# Patient Record
Sex: Female | Born: 1950 | Race: White | Hispanic: No | Marital: Married | Smoking: Former smoker
Health system: Southern US, Community
[De-identification: ages and names within clinical notes are randomized; demographics above are authoritative.]

## PROBLEM LIST (undated history)

## (undated) DIAGNOSIS — D649 Anemia, unspecified: Secondary | ICD-10-CM

## (undated) DIAGNOSIS — I10 Essential (primary) hypertension: Secondary | ICD-10-CM

## (undated) HISTORY — DX: Essential (primary) hypertension: I10

## (undated) HISTORY — PX: EYE SURGERY: SHX253

## (undated) HISTORY — PX: ABDOMINAL HYSTERECTOMY: SHX81

## (undated) HISTORY — PX: PARTIAL HYSTERECTOMY: SHX80

## (undated) HISTORY — PX: CHOLECYSTECTOMY: SHX55

---

## 1997-04-10 ENCOUNTER — Ambulatory Visit (HOSPITAL_COMMUNITY): Admission: RE | Admit: 1997-04-10 | Discharge: 1997-04-10 | Payer: Self-pay | Admitting: Obstetrics and Gynecology

## 1998-03-21 ENCOUNTER — Other Ambulatory Visit: Admission: RE | Admit: 1998-03-21 | Discharge: 1998-03-21 | Payer: Self-pay | Admitting: Obstetrics and Gynecology

## 1998-04-06 ENCOUNTER — Ambulatory Visit (HOSPITAL_COMMUNITY): Admission: RE | Admit: 1998-04-06 | Discharge: 1998-04-06 | Payer: Self-pay | Admitting: Obstetrics and Gynecology

## 1999-04-23 ENCOUNTER — Other Ambulatory Visit: Admission: RE | Admit: 1999-04-23 | Discharge: 1999-04-23 | Payer: Self-pay | Admitting: Obstetrics and Gynecology

## 1999-05-30 ENCOUNTER — Encounter: Payer: Self-pay | Admitting: Obstetrics and Gynecology

## 1999-05-30 ENCOUNTER — Encounter: Admission: RE | Admit: 1999-05-30 | Discharge: 1999-05-30 | Payer: Self-pay | Admitting: Obstetrics and Gynecology

## 2000-09-09 ENCOUNTER — Encounter: Admission: RE | Admit: 2000-09-09 | Discharge: 2000-09-09 | Payer: Self-pay | Admitting: Obstetrics and Gynecology

## 2000-09-09 ENCOUNTER — Encounter: Payer: Self-pay | Admitting: Obstetrics and Gynecology

## 2000-10-22 ENCOUNTER — Other Ambulatory Visit: Admission: RE | Admit: 2000-10-22 | Discharge: 2000-10-22 | Payer: Self-pay | Admitting: Obstetrics and Gynecology

## 2002-07-05 ENCOUNTER — Encounter: Payer: Self-pay | Admitting: Obstetrics and Gynecology

## 2002-07-05 ENCOUNTER — Encounter: Admission: RE | Admit: 2002-07-05 | Discharge: 2002-07-05 | Payer: Self-pay | Admitting: Obstetrics and Gynecology

## 2002-07-22 ENCOUNTER — Encounter: Payer: Self-pay | Admitting: Gastroenterology

## 2002-07-22 ENCOUNTER — Encounter: Admission: RE | Admit: 2002-07-22 | Discharge: 2002-07-22 | Payer: Self-pay | Admitting: Gastroenterology

## 2003-11-07 ENCOUNTER — Ambulatory Visit (HOSPITAL_COMMUNITY): Admission: RE | Admit: 2003-11-07 | Discharge: 2003-11-07 | Payer: Self-pay | Admitting: Gastroenterology

## 2003-11-07 ENCOUNTER — Encounter (INDEPENDENT_AMBULATORY_CARE_PROVIDER_SITE_OTHER): Payer: Self-pay | Admitting: *Deleted

## 2005-04-02 ENCOUNTER — Encounter: Admission: RE | Admit: 2005-04-02 | Discharge: 2005-04-02 | Payer: Self-pay | Admitting: Obstetrics and Gynecology

## 2005-05-14 ENCOUNTER — Encounter: Payer: Self-pay | Admitting: Obstetrics and Gynecology

## 2006-08-04 ENCOUNTER — Encounter: Admission: RE | Admit: 2006-08-04 | Discharge: 2006-08-04 | Payer: Self-pay | Admitting: Family Medicine

## 2006-10-24 ENCOUNTER — Encounter: Admission: RE | Admit: 2006-10-24 | Discharge: 2006-10-24 | Payer: Self-pay | Admitting: Specialist

## 2006-11-30 ENCOUNTER — Encounter: Admission: RE | Admit: 2006-11-30 | Discharge: 2006-12-16 | Payer: Self-pay | Admitting: Neurosurgery

## 2008-05-30 ENCOUNTER — Emergency Department (HOSPITAL_BASED_OUTPATIENT_CLINIC_OR_DEPARTMENT_OTHER): Admission: EM | Admit: 2008-05-30 | Discharge: 2008-05-31 | Payer: Self-pay | Admitting: Emergency Medicine

## 2008-05-31 ENCOUNTER — Ambulatory Visit: Payer: Self-pay | Admitting: Interventional Radiology

## 2008-12-18 ENCOUNTER — Encounter: Admission: RE | Admit: 2008-12-18 | Discharge: 2008-12-18 | Payer: Self-pay | Admitting: Obstetrics and Gynecology

## 2010-02-03 ENCOUNTER — Encounter: Payer: Self-pay | Admitting: Obstetrics and Gynecology

## 2010-03-11 ENCOUNTER — Emergency Department (HOSPITAL_COMMUNITY): Payer: BC Managed Care – PPO

## 2010-03-11 ENCOUNTER — Emergency Department (HOSPITAL_COMMUNITY)
Admission: EM | Admit: 2010-03-11 | Discharge: 2010-03-11 | Disposition: A | Discharge status: Home / Self Care | Payer: BC Managed Care – PPO | Attending: Emergency Medicine | Admitting: Emergency Medicine

## 2010-03-11 DIAGNOSIS — I10 Essential (primary) hypertension: Secondary | ICD-10-CM | POA: Insufficient documentation

## 2010-03-11 DIAGNOSIS — R109 Unspecified abdominal pain: Secondary | ICD-10-CM | POA: Insufficient documentation

## 2010-03-11 DIAGNOSIS — N281 Cyst of kidney, acquired: Secondary | ICD-10-CM | POA: Insufficient documentation

## 2010-03-11 DIAGNOSIS — Z9089 Acquired absence of other organs: Secondary | ICD-10-CM | POA: Insufficient documentation

## 2010-03-11 LAB — CBC
Hemoglobin: 12.8 g/dL (ref 12.0–15.0)
Platelets: 218 10*3/uL (ref 150–400)
RBC: 4.32 MIL/uL (ref 3.87–5.11)
WBC: 8.2 10*3/uL (ref 4.0–10.5)

## 2010-03-11 LAB — DIFFERENTIAL
Basophils Relative: 0 % (ref 0–1)
Eosinophils Absolute: 0.1 10*3/uL (ref 0.0–0.7)
Lymphs Abs: 1.5 10*3/uL (ref 0.7–4.0)
Monocytes Relative: 3 % (ref 3–12)
Neutro Abs: 6.3 10*3/uL (ref 1.7–7.7)
Neutrophils Relative %: 77 % (ref 43–77)

## 2010-03-11 LAB — URINALYSIS, ROUTINE W REFLEX MICROSCOPIC
Hgb urine dipstick: NEGATIVE
Protein, ur: NEGATIVE mg/dL
Specific Gravity, Urine: 1.02 (ref 1.005–1.030)
Urine Glucose, Fasting: NEGATIVE mg/dL
Urobilinogen, UA: 0.2 mg/dL (ref 0.0–1.0)

## 2010-03-11 LAB — COMPREHENSIVE METABOLIC PANEL
ALT: 17 U/L (ref 0–35)
AST: 23 U/L (ref 0–37)
Alkaline Phosphatase: 55 U/L (ref 39–117)
CO2: 27 mEq/L (ref 19–32)
Chloride: 104 mEq/L (ref 96–112)
GFR calc non Af Amer: >60 mL/min (ref >60)
Glucose, Bld: 144 mg/dL — ABNORMAL HIGH (ref 70–99)
Sodium: 139 mEq/L (ref 135–145)
Total Bilirubin: 0.5 mg/dL (ref 0.3–1.2)

## 2010-03-11 MED ORDER — IOHEXOL 300 MG/ML  SOLN
100.0000 mL | Freq: Once | INTRAMUSCULAR | Status: AC | PRN
  Administered 2010-03-11: 100 mL via INTRAVENOUS

## 2010-04-15 ENCOUNTER — Ambulatory Visit
Admission: RE | Admit: 2010-04-15 | Discharge: 2010-04-15 | Disposition: A | Discharge status: Home / Self Care | Payer: BC Managed Care – PPO | Source: Ambulatory Visit | Attending: Family Medicine | Admitting: Family Medicine

## 2010-04-15 ENCOUNTER — Emergency Department (HOSPITAL_COMMUNITY)
Admission: EM | Admit: 2010-04-15 | Discharge: 2010-04-16 | Disposition: A | Discharge status: Home / Self Care | Payer: BC Managed Care – PPO | Attending: Emergency Medicine | Admitting: Emergency Medicine

## 2010-04-15 ENCOUNTER — Other Ambulatory Visit: Payer: Self-pay | Admitting: Family Medicine

## 2010-04-15 DIAGNOSIS — R109 Unspecified abdominal pain: Secondary | ICD-10-CM | POA: Insufficient documentation

## 2010-04-15 DIAGNOSIS — R112 Nausea with vomiting, unspecified: Secondary | ICD-10-CM | POA: Insufficient documentation

## 2010-04-15 DIAGNOSIS — R1031 Right lower quadrant pain: Secondary | ICD-10-CM

## 2010-04-15 DIAGNOSIS — M545 Low back pain: Secondary | ICD-10-CM

## 2010-04-15 DIAGNOSIS — Z90710 Acquired absence of both cervix and uterus: Secondary | ICD-10-CM | POA: Insufficient documentation

## 2010-04-15 DIAGNOSIS — Z9089 Acquired absence of other organs: Secondary | ICD-10-CM | POA: Insufficient documentation

## 2010-04-16 LAB — URINALYSIS, ROUTINE W REFLEX MICROSCOPIC
Bilirubin Urine: NEGATIVE
Glucose, UA: NEGATIVE mg/dL
Ketones, ur: 40 mg/dL — AB
Protein, ur: NEGATIVE mg/dL

## 2010-04-16 LAB — URINE MICROSCOPIC-ADD ON

## 2010-04-17 ENCOUNTER — Other Ambulatory Visit: Payer: Self-pay | Admitting: Surgery

## 2010-04-17 DIAGNOSIS — R1031 Right lower quadrant pain: Secondary | ICD-10-CM

## 2010-04-17 LAB — URINE CULTURE

## 2010-04-19 ENCOUNTER — Ambulatory Visit
Admission: RE | Admit: 2010-04-19 | Discharge: 2010-04-19 | Disposition: A | Discharge status: Home / Self Care | Payer: BC Managed Care – PPO | Source: Ambulatory Visit | Attending: Surgery | Admitting: Surgery

## 2010-04-19 DIAGNOSIS — R1031 Right lower quadrant pain: Secondary | ICD-10-CM

## 2010-04-23 LAB — COMPREHENSIVE METABOLIC PANEL
ALT: 12 U/L (ref 0–35)
BUN: 18 mg/dL (ref 6–23)
Calcium: 9.4 mg/dL (ref 8.4–10.5)
Glucose, Bld: 134 mg/dL — ABNORMAL HIGH (ref 70–99)
Sodium: 144 mEq/L (ref 135–145)
Total Protein: 7.9 g/dL (ref 6.0–8.3)

## 2010-04-23 LAB — DIFFERENTIAL
Lymphocytes Relative: 17 % (ref 12–46)
Lymphs Abs: 2.3 10*3/uL (ref 0.7–4.0)
Neutrophils Relative %: 79 % — ABNORMAL HIGH (ref 43–77)

## 2010-04-23 LAB — CBC
MCHC: 34.1 g/dL (ref 30.0–36.0)
MCV: 89.1 fL (ref 78.0–100.0)
RBC: 4.25 MIL/uL (ref 3.87–5.11)
RDW: 12.8 % (ref 11.5–15.5)

## 2010-04-23 LAB — URINALYSIS, ROUTINE W REFLEX MICROSCOPIC
Nitrite: NEGATIVE
Specific Gravity, Urine: 1.02 (ref 1.005–1.030)
pH: 6.5 (ref 5.0–8.0)

## 2010-04-23 LAB — URINE CULTURE: Colony Count: NO GROWTH

## 2010-04-23 LAB — LIPASE, BLOOD: Lipase: 78 U/L (ref 23–300)

## 2010-05-31 NOTE — Op Note (Signed)
NAMEDONETA, BAYMAN              ACCOUNT NO.:  [id number]   MEDICAL RECORD NO.:  [id number]          PATIENT TYPE:  AMB   LOCATION:  ENDO                         FACILITY:  [facility]   PHYSICIAN:  John C. Madilyn Fireman, M.D.    DATE OF BIRTH:  Jun 08, 1950   DATE OF PROCEDURE:  11/07/2003  DATE OF DISCHARGE:                                 OPERATIVE REPORT   PROCEDURE:  Colonoscopy.   INDICATION FOR PROCEDURE:  Rectal bleeding and family history of colon  cancer in a second degree relative.   PROCEDURE:  The patient was placed in the left lateral decubitus position  and placed on the pulse monitor with continuous low-flow oxygen delivered by  nasal cannula.  She was sedated with 100 mcg IV fentanyl and 10 mg IV  Versed.  The Olympus video colonoscope was inserted into the rectum and  advanced to the cecum, confirmed by transillumination at McBurney's point  and visualization of the ileocecal valve and appendiceal orifice.  The prep  was good.  The cecum and ascending colon appeared normal with no masses,  polyps, diverticula, or other mucosal abnormalities.  Within the transverse  colon there was an 8 x 4 mm sessile polyp that was removed by snare.  The  remainder of the transverse, descending, and sigmoid colon appeared normal.  Within the rectum there was seen an 8 mm polyp at 12 cm that was removed by  snare.  The scope was then withdrawn and the patient returned to the  recovery room in stable condition.  She tolerated the procedure well, and  there were no immediate complications.   IMPRESSION:  Transverse and rectal polyps.   PLAN:  Await histology to determine method and interval for future colon  screening.      JCH/MEDQ  D:  11/07/2003  T:  11/07/2003  Job:  [id number]   cc:   Chales Salmon. Abigail Miyamoto, M.D.  [address]  [place]  [place] [address]  Fax: [phone]

## 2010-07-26 ENCOUNTER — Other Ambulatory Visit: Payer: Self-pay | Admitting: Obstetrics and Gynecology

## 2010-07-26 DIAGNOSIS — Z1231 Encounter for screening mammogram for malignant neoplasm of breast: Secondary | ICD-10-CM

## 2010-08-12 ENCOUNTER — Ambulatory Visit
Admission: RE | Admit: 2010-08-12 | Discharge: 2010-08-12 | Disposition: A | Discharge status: Home / Self Care | Payer: BC Managed Care – PPO | Source: Ambulatory Visit | Attending: Obstetrics and Gynecology | Admitting: Obstetrics and Gynecology

## 2010-08-12 DIAGNOSIS — Z1231 Encounter for screening mammogram for malignant neoplasm of breast: Secondary | ICD-10-CM

## 2011-07-03 IMAGING — CT CT ABD-PELV W/O CM
2 of 4 series · 17 of 46 positions shown, 19 images · non-contrast
Comparison: 03/11/2010

CLINICAL DATA: Low back pain and right lower quadrant pain.

CT ABDOMEN AND PELVIS WITHOUT CONTRAST
TECHNIQUE: Multidetector CT imaging of the abdomen and pelvis was
performed following the standard protocol without intravenous
contrast.

[Series 2: renal stone w/o · axial · non-contrast · 0.80mm/px · z∈[-458,-42]mm · 14 of 91 slices shown, 16 images]
[im 4/91  soft-tissue]
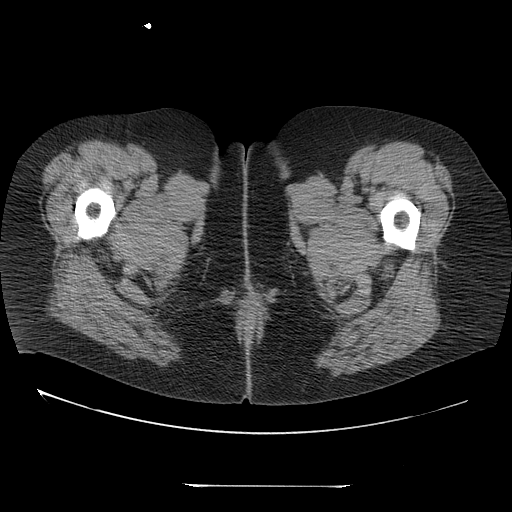
[im 4/91  bone]
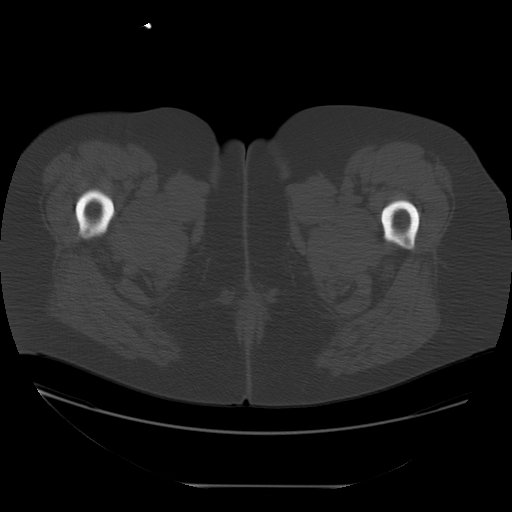
[im 12/91  soft-tissue]
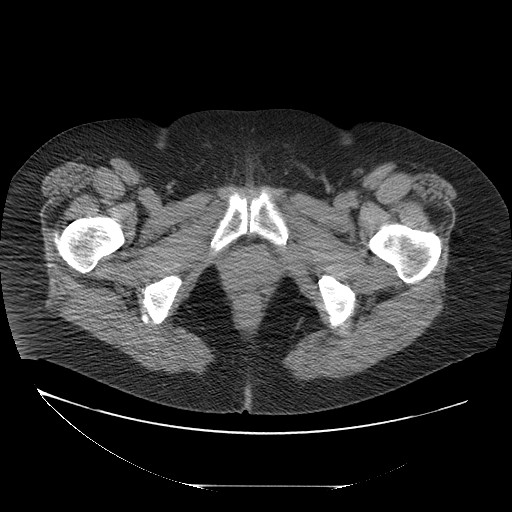
[im 19/91  soft-tissue]
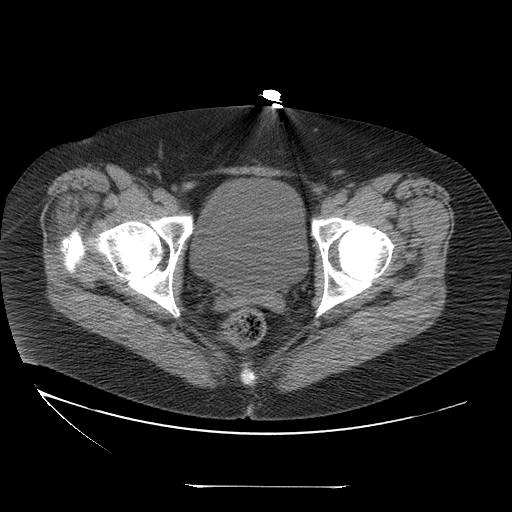
[im 23/91  soft-tissue]
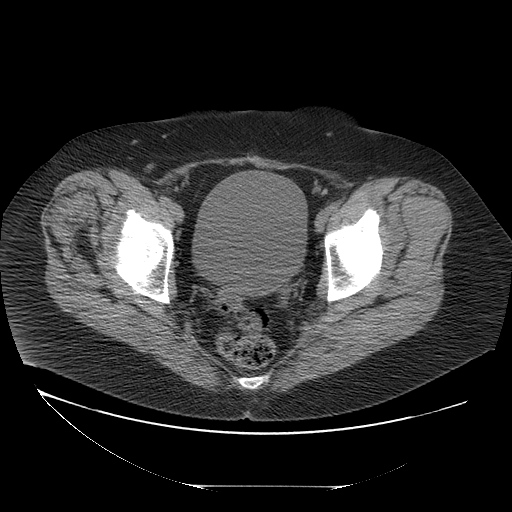
[im 31/91  soft-tissue]
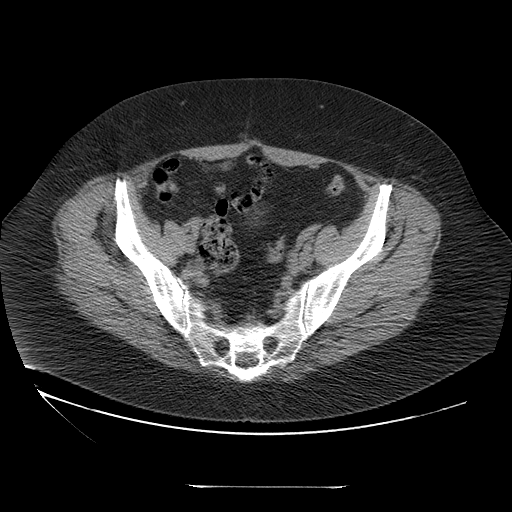
[im 38/91  soft-tissue]
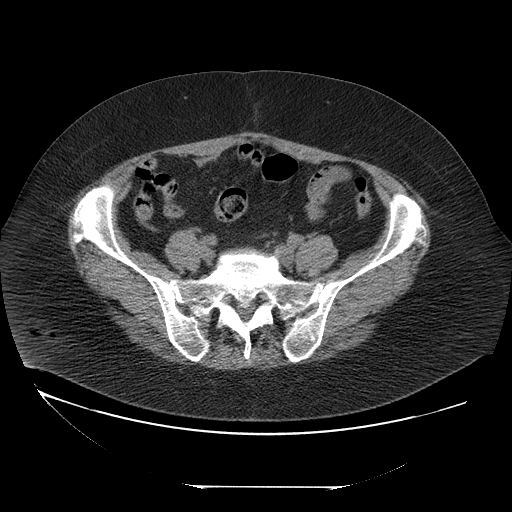
[im 42/91  soft-tissue]
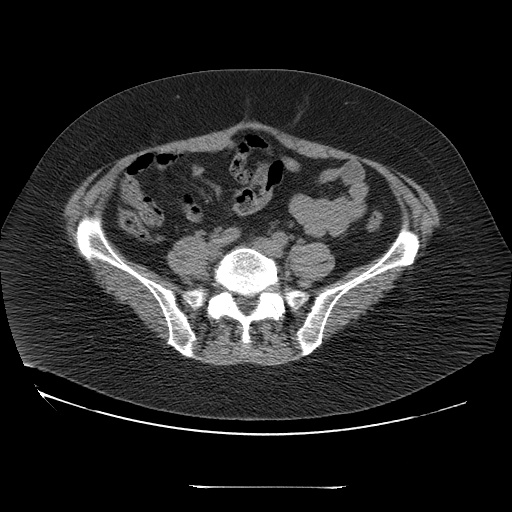
[im 49/91  soft-tissue]
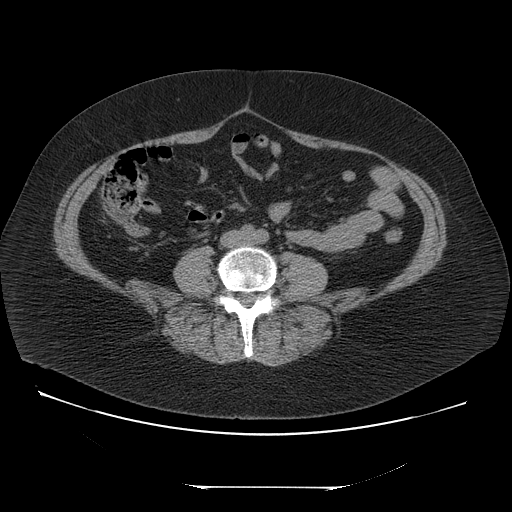
[im 53/91  soft-tissue]
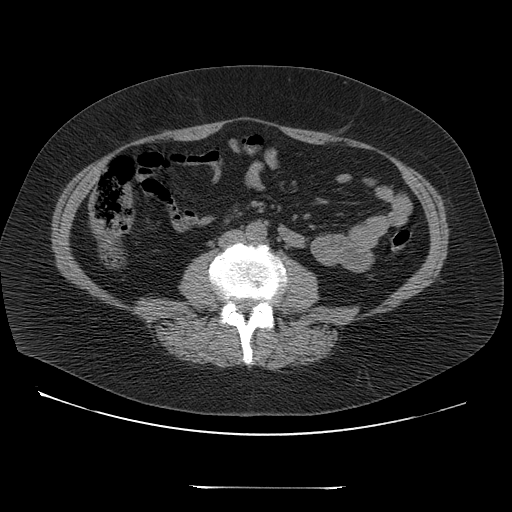
[im 53/91  bone]
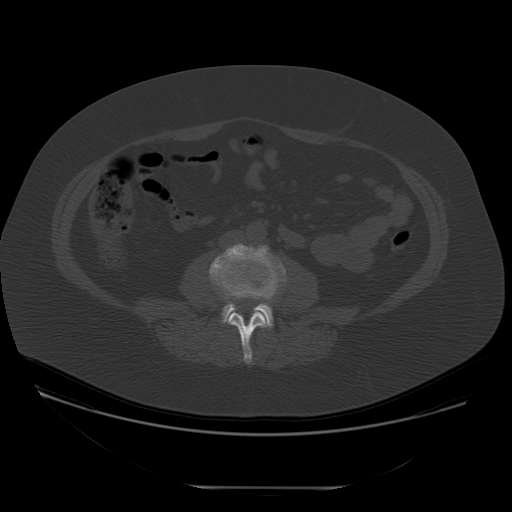
[im 61/91  soft-tissue]
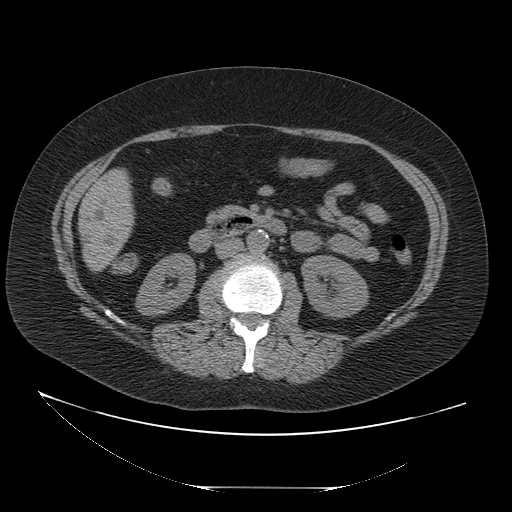
[im 68/91  soft-tissue]
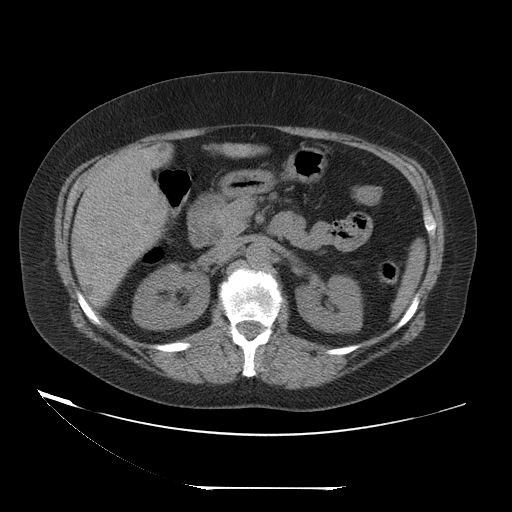
[im 72/91  soft-tissue]
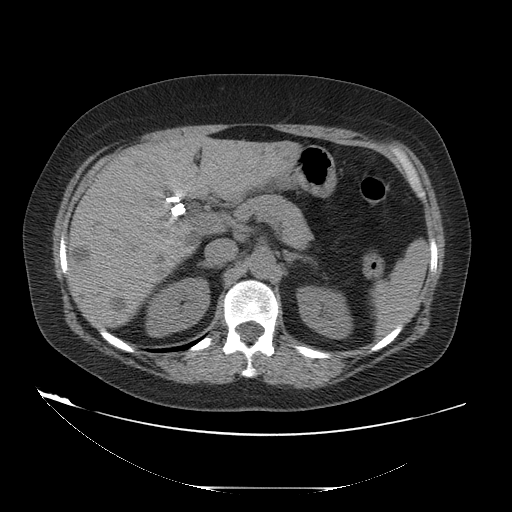
[im 79/91  soft-tissue]
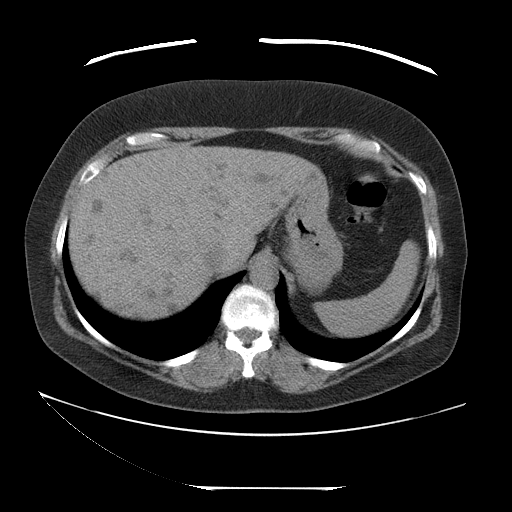
[im 87/91  soft-tissue]
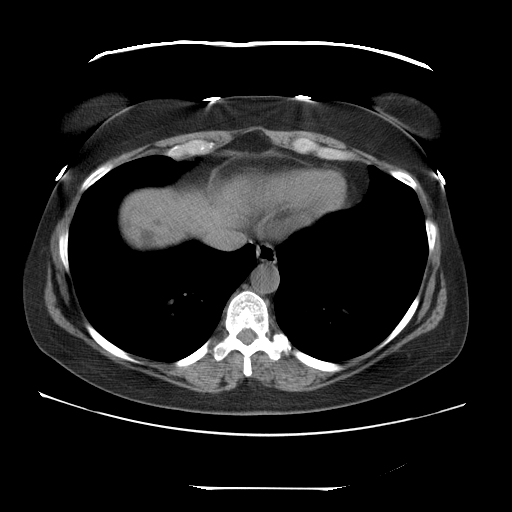

[Series 400: cor · coronal · 0.90mm/px · 3 of 118 slices shown]
[im 40/118  soft-tissue]
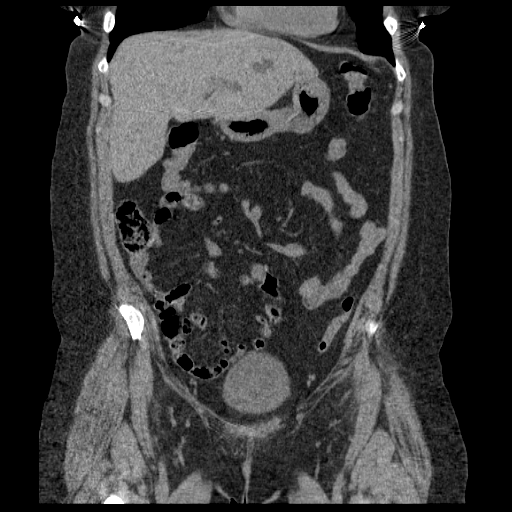
[im 53/118  soft-tissue]
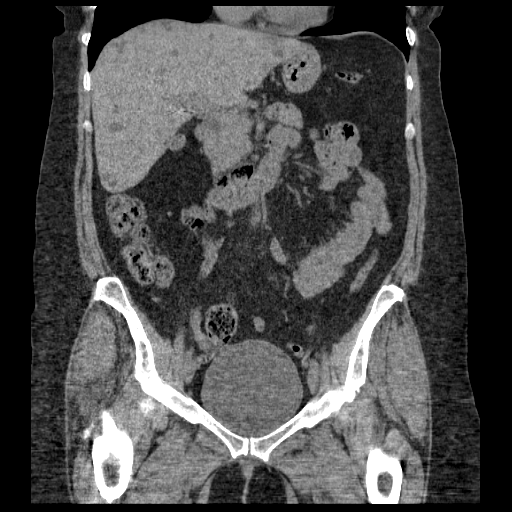
[im 66/118  soft-tissue]
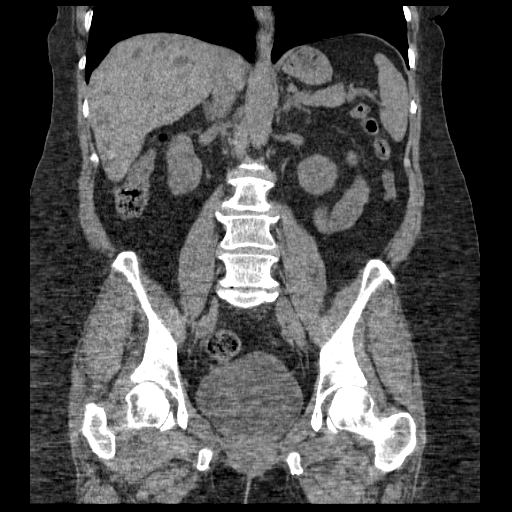

[17 of 46 positions shown; findings below may reference images not displayed]

FINDINGS: There are numerous cystic lesions in the liver, stable.
The pancreas, spleen, adrenal glands, and kidneys are normal. There
are no ureteral or renal calculi.

The appendix and terminal ileum are normal.  The remainder of the
bowel is also normal.  Ovaries are normal.  Uterus and gallbladder
have been removed.  There is no free air or free fluid in the
abdomen.  No significant osseous abnormality.
IMPRESSION: 1.  No acute abnormalities.
2.  Numerous stable cystic lesions in the liver.
3.  Normal.  Kidneys and appendix.

## 2011-07-07 IMAGING — US US TRANSVAGINAL NON-OB
1 series · 14 of 25 positions shown · non-contrast
Comparison: CT scan of the abdomen and pelvis dated 04/15/2010

CLINICAL DATA: Right lower quadrant pain.

TRANSABDOMINAL AND TRANSVAGINAL ULTRASOUND OF PELVIS
TECHNIQUE: Both transabdominal and transvaginal ultrasound
examinations of the pelvis were performed. Transabdominal technique
was performed for global imaging of the pelvis including ovaries,
adnexal regions, and pelvic cul-de-sac.
It was necessary to proceed with endovaginal exam following the
transabdominal exam to visualize the ovaries and adnexa.

[Series 1: us transvaginal non-ob · 0.30mm/px · 14 of 43 slices shown]
[im 1/43]
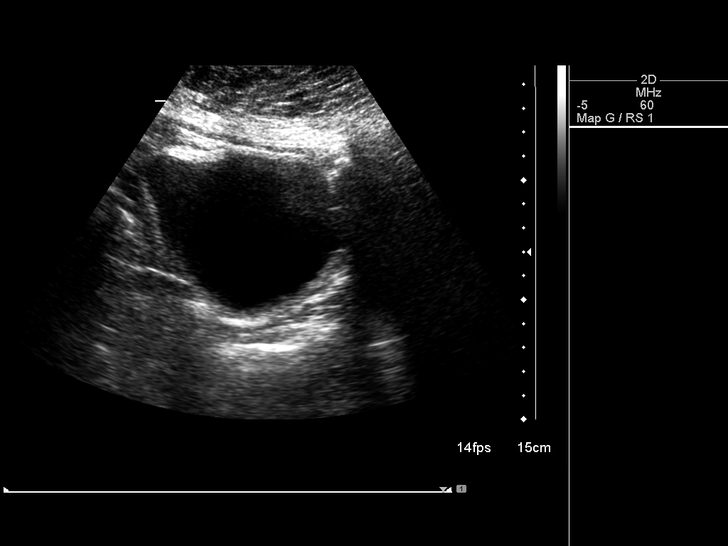
[im 4/43]
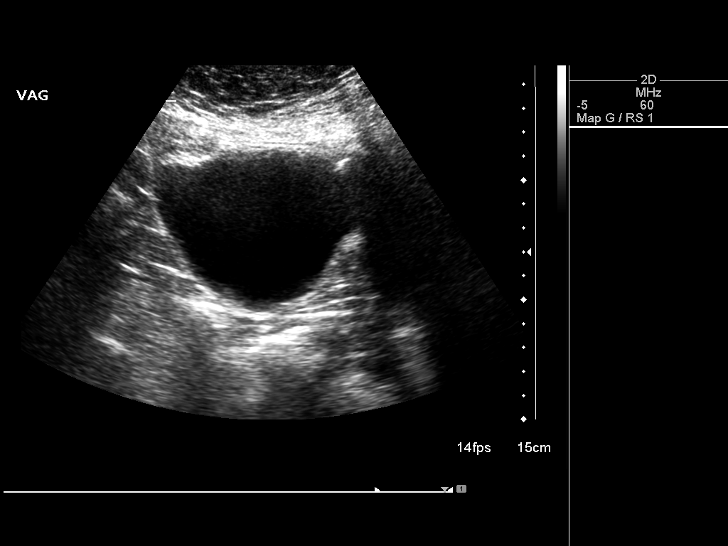
[im 8/43]
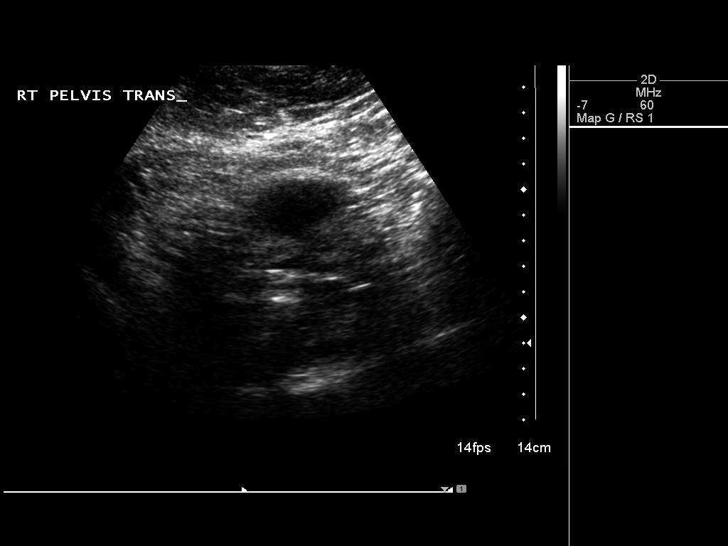
[im 11/43]
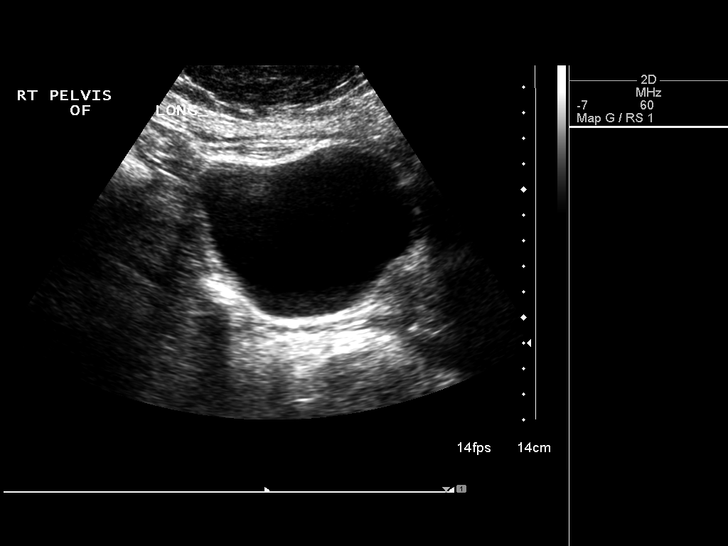
[im 15/43]
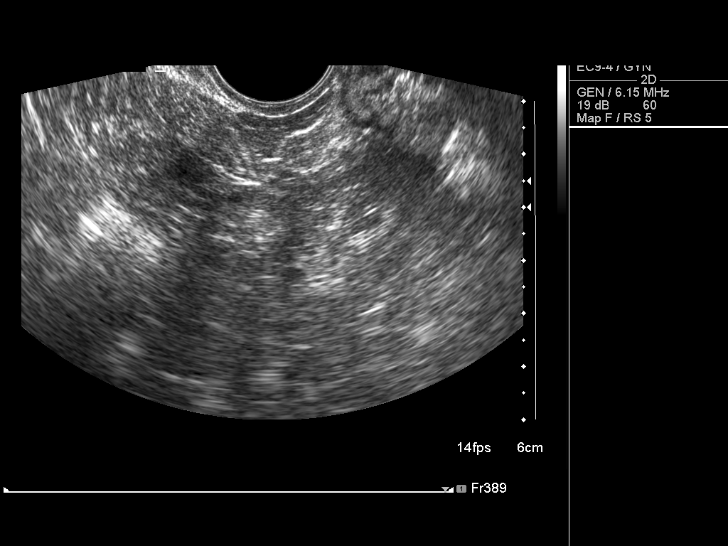
[im 16/43]
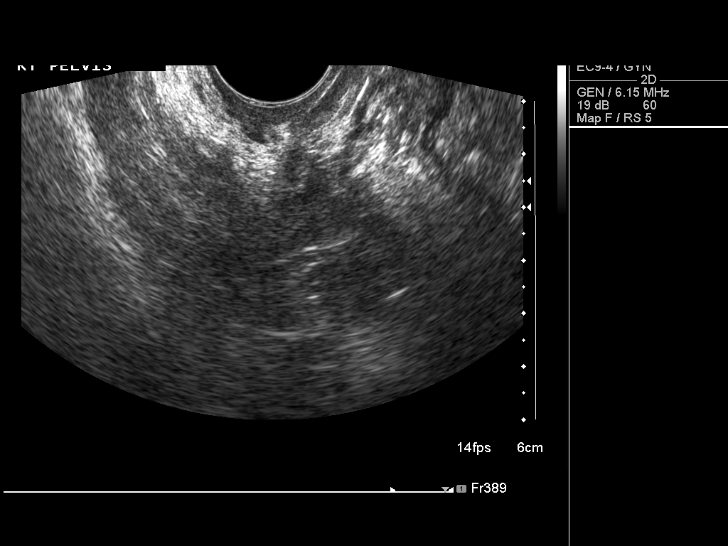
[im 20/43]
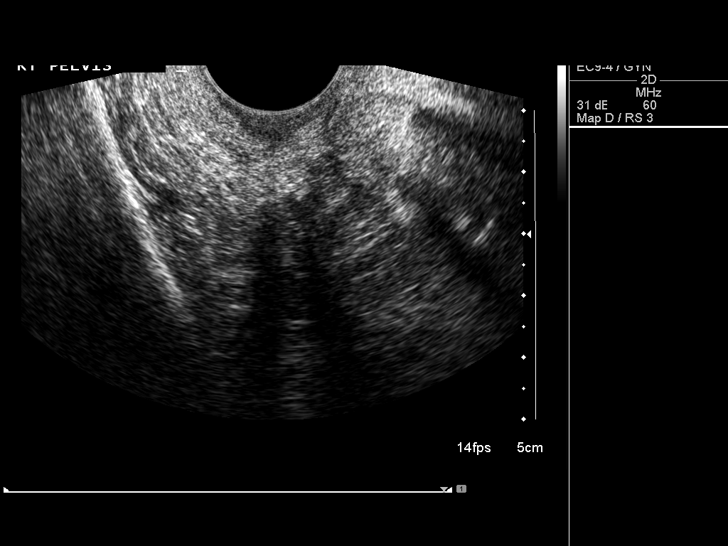
[im 23/43]
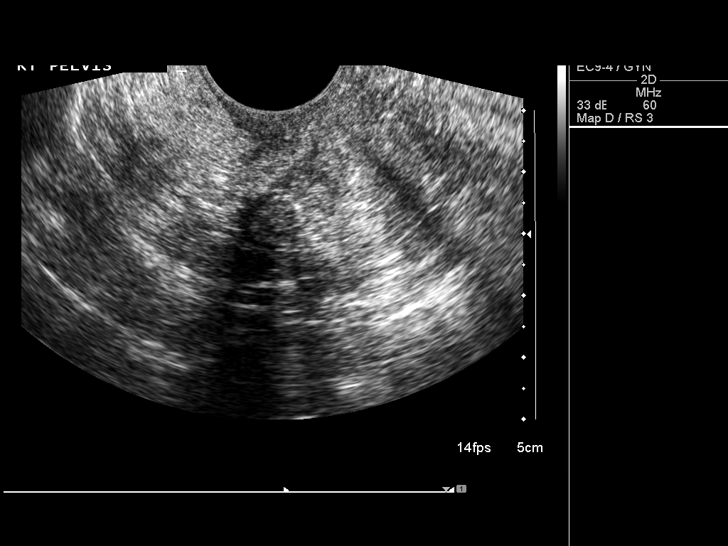
[im 27/43]
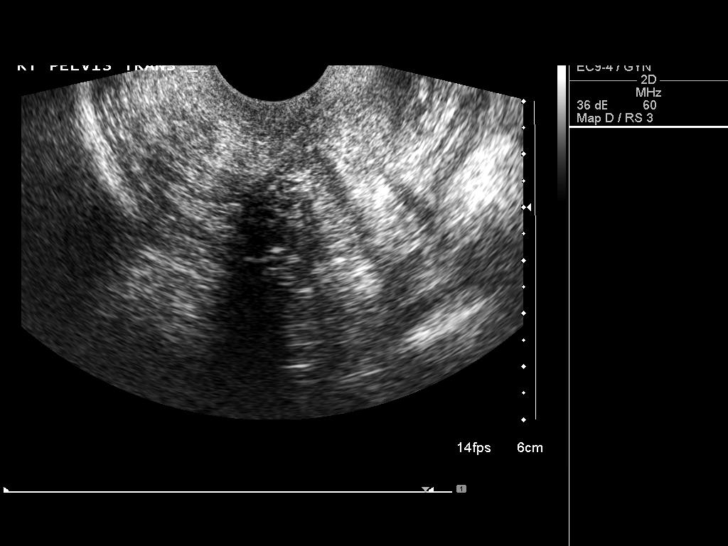
[im 29/43]
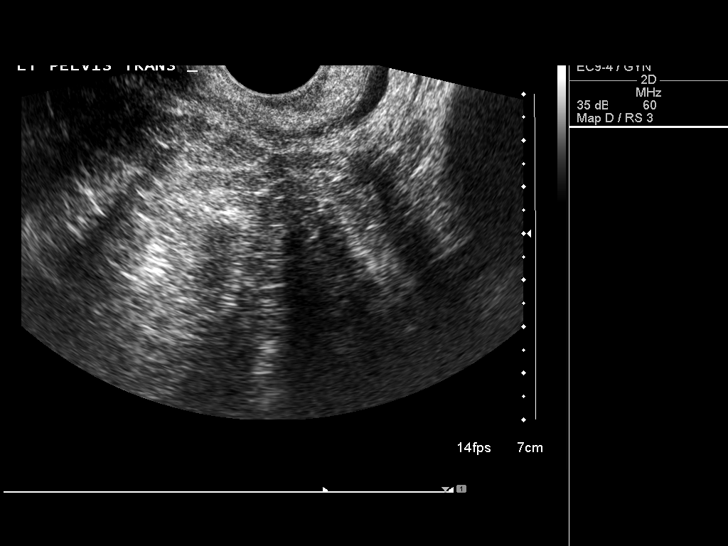
[im 32/43]
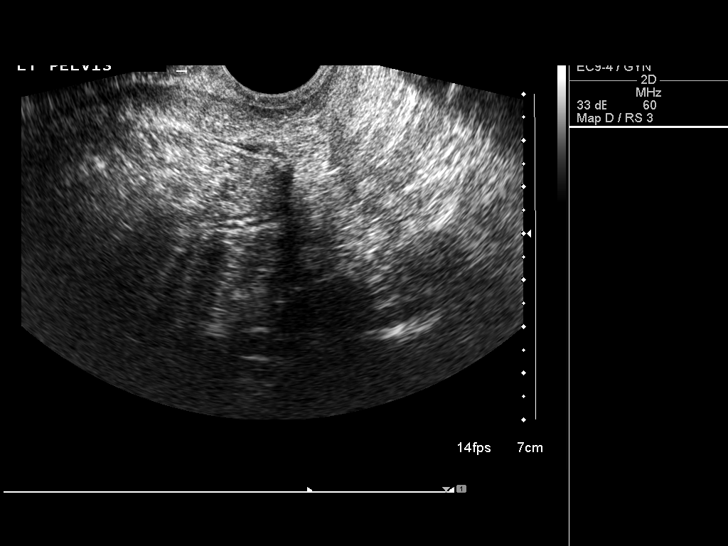
[im 36/43]
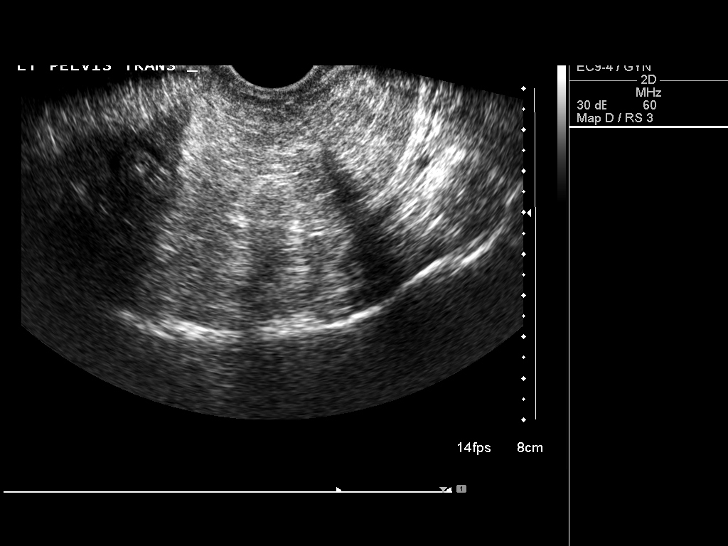
[im 39/43]
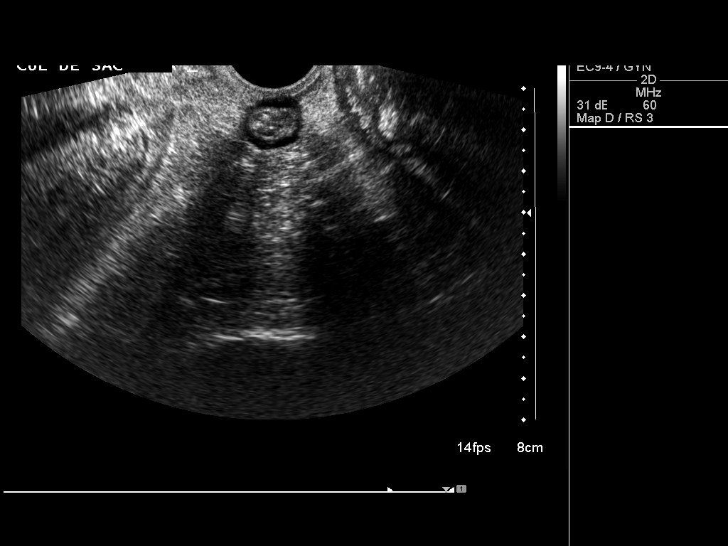
[im 43/43]
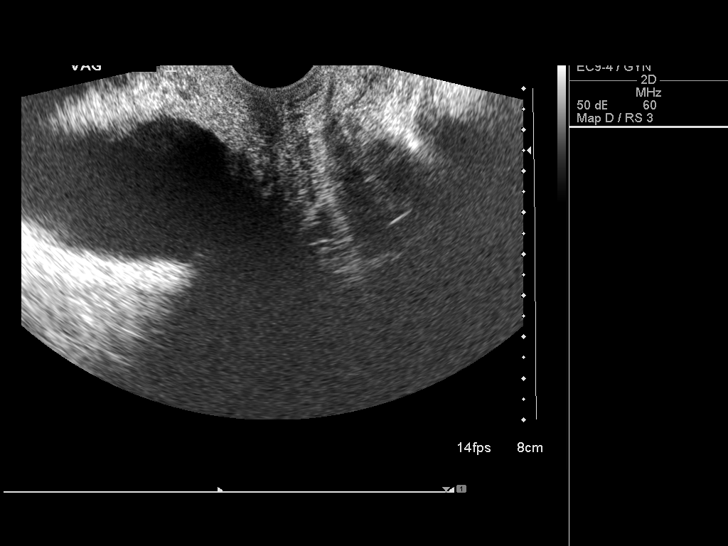

[14 of 25 positions shown; findings below may reference images not displayed]

FINDINGS: Uterus:  Removed.

Right ovary:  Not visualized.

Left ovary:  Not visualized.

Other findings:  No free fluid.
IMPRESSION: The uterus has been removed.  The ovaries are not identified on
ultrasound.  Review of the prior CT scan dated 04/15/2010
demonstrates normal appearing ovaries bilaterally lying anterior to
the distal psoas muscles.

## 2013-08-15 ENCOUNTER — Other Ambulatory Visit: Payer: Self-pay

## 2013-08-15 DIAGNOSIS — Z1231 Encounter for screening mammogram for malignant neoplasm of breast: Secondary | ICD-10-CM

## 2013-08-19 ENCOUNTER — Ambulatory Visit: Payer: BC Managed Care – PPO

## 2013-08-29 ENCOUNTER — Ambulatory Visit
Admission: RE | Admit: 2013-08-29 | Discharge: 2013-08-29 | Disposition: A | Discharge status: Home / Self Care | Payer: BC Managed Care – PPO | Source: Ambulatory Visit

## 2013-08-29 DIAGNOSIS — Z1231 Encounter for screening mammogram for malignant neoplasm of breast: Secondary | ICD-10-CM

## 2013-09-02 ENCOUNTER — Other Ambulatory Visit: Payer: Self-pay | Admitting: Family Medicine

## 2013-09-02 DIAGNOSIS — N63 Unspecified lump in unspecified breast: Secondary | ICD-10-CM

## 2013-09-06 ENCOUNTER — Other Ambulatory Visit: Payer: Self-pay | Admitting: Family Medicine

## 2013-09-06 DIAGNOSIS — N644 Mastodynia: Secondary | ICD-10-CM

## 2013-09-06 DIAGNOSIS — N6489 Other specified disorders of breast: Secondary | ICD-10-CM

## 2013-09-06 DIAGNOSIS — R2231 Localized swelling, mass and lump, right upper limb: Secondary | ICD-10-CM

## 2013-09-09 ENCOUNTER — Ambulatory Visit
Admission: RE | Admit: 2013-09-09 | Discharge: 2013-09-09 | Disposition: A | Discharge status: Home / Self Care | Payer: BC Managed Care – PPO | Source: Ambulatory Visit | Attending: Family Medicine | Admitting: Family Medicine

## 2013-09-09 DIAGNOSIS — N6489 Other specified disorders of breast: Secondary | ICD-10-CM

## 2013-09-09 DIAGNOSIS — N644 Mastodynia: Secondary | ICD-10-CM

## 2013-09-09 DIAGNOSIS — R2231 Localized swelling, mass and lump, right upper limb: Secondary | ICD-10-CM

## 2014-12-11 ENCOUNTER — Other Ambulatory Visit: Payer: Self-pay

## 2014-12-11 DIAGNOSIS — Z1231 Encounter for screening mammogram for malignant neoplasm of breast: Secondary | ICD-10-CM

## 2015-01-22 ENCOUNTER — Ambulatory Visit: Payer: BC Managed Care – PPO

## 2015-02-19 ENCOUNTER — Ambulatory Visit
Admission: RE | Admit: 2015-02-19 | Discharge: 2015-02-19 | Disposition: A | Discharge status: Home / Self Care | Payer: BC Managed Care – PPO | Source: Ambulatory Visit

## 2015-02-19 DIAGNOSIS — Z1231 Encounter for screening mammogram for malignant neoplasm of breast: Secondary | ICD-10-CM

## 2016-02-13 ENCOUNTER — Other Ambulatory Visit: Payer: Self-pay | Admitting: Family Medicine

## 2016-02-13 DIAGNOSIS — Z1231 Encounter for screening mammogram for malignant neoplasm of breast: Secondary | ICD-10-CM

## 2016-04-01 ENCOUNTER — Ambulatory Visit
Admission: RE | Admit: 2016-04-01 | Discharge: 2016-04-01 | Disposition: A | Discharge status: Home / Self Care | Payer: BC Managed Care – PPO | Source: Ambulatory Visit | Attending: Family Medicine | Admitting: Family Medicine

## 2016-04-01 DIAGNOSIS — Z1231 Encounter for screening mammogram for malignant neoplasm of breast: Secondary | ICD-10-CM

## 2016-04-17 DIAGNOSIS — H40011 Open angle with borderline findings, low risk, right eye: Secondary | ICD-10-CM | POA: Diagnosis not present

## 2016-04-17 DIAGNOSIS — H268 Other specified cataract: Secondary | ICD-10-CM | POA: Diagnosis not present

## 2016-04-17 DIAGNOSIS — H40013 Open angle with borderline findings, low risk, bilateral: Secondary | ICD-10-CM | POA: Diagnosis not present

## 2016-04-17 DIAGNOSIS — H04123 Dry eye syndrome of bilateral lacrimal glands: Secondary | ICD-10-CM | POA: Diagnosis not present

## 2016-04-17 DIAGNOSIS — H40053 Ocular hypertension, bilateral: Secondary | ICD-10-CM | POA: Diagnosis not present

## 2016-05-12 DIAGNOSIS — H40012 Open angle with borderline findings, low risk, left eye: Secondary | ICD-10-CM | POA: Diagnosis not present

## 2016-06-30 DIAGNOSIS — H25041 Posterior subcapsular polar age-related cataract, right eye: Secondary | ICD-10-CM | POA: Diagnosis not present

## 2016-06-30 DIAGNOSIS — H25013 Cortical age-related cataract, bilateral: Secondary | ICD-10-CM | POA: Diagnosis not present

## 2016-06-30 DIAGNOSIS — H40013 Open angle with borderline findings, low risk, bilateral: Secondary | ICD-10-CM | POA: Diagnosis not present

## 2016-06-30 DIAGNOSIS — H25011 Cortical age-related cataract, right eye: Secondary | ICD-10-CM | POA: Diagnosis not present

## 2016-06-30 DIAGNOSIS — H25043 Posterior subcapsular polar age-related cataract, bilateral: Secondary | ICD-10-CM | POA: Diagnosis not present

## 2016-06-30 DIAGNOSIS — H2513 Age-related nuclear cataract, bilateral: Secondary | ICD-10-CM | POA: Diagnosis not present

## 2016-06-30 DIAGNOSIS — H2511 Age-related nuclear cataract, right eye: Secondary | ICD-10-CM | POA: Diagnosis not present

## 2016-07-29 DIAGNOSIS — H2511 Age-related nuclear cataract, right eye: Secondary | ICD-10-CM | POA: Diagnosis not present

## 2016-07-29 DIAGNOSIS — H25811 Combined forms of age-related cataract, right eye: Secondary | ICD-10-CM | POA: Diagnosis not present

## 2016-08-11 DIAGNOSIS — H25042 Posterior subcapsular polar age-related cataract, left eye: Secondary | ICD-10-CM | POA: Diagnosis not present

## 2016-08-11 DIAGNOSIS — H2512 Age-related nuclear cataract, left eye: Secondary | ICD-10-CM | POA: Diagnosis not present

## 2016-08-11 DIAGNOSIS — H25012 Cortical age-related cataract, left eye: Secondary | ICD-10-CM | POA: Diagnosis not present

## 2016-08-19 DIAGNOSIS — H25812 Combined forms of age-related cataract, left eye: Secondary | ICD-10-CM | POA: Diagnosis not present

## 2016-08-19 DIAGNOSIS — H2512 Age-related nuclear cataract, left eye: Secondary | ICD-10-CM | POA: Diagnosis not present

## 2016-10-13 DIAGNOSIS — E559 Vitamin D deficiency, unspecified: Secondary | ICD-10-CM | POA: Diagnosis not present

## 2016-10-13 DIAGNOSIS — Z1159 Encounter for screening for other viral diseases: Secondary | ICD-10-CM | POA: Diagnosis not present

## 2016-10-13 DIAGNOSIS — Z780 Asymptomatic menopausal state: Secondary | ICD-10-CM | POA: Diagnosis not present

## 2016-10-13 DIAGNOSIS — I10 Essential (primary) hypertension: Secondary | ICD-10-CM | POA: Diagnosis not present

## 2016-10-13 DIAGNOSIS — R6889 Other general symptoms and signs: Secondary | ICD-10-CM | POA: Diagnosis not present

## 2016-10-13 DIAGNOSIS — Z23 Encounter for immunization: Secondary | ICD-10-CM | POA: Diagnosis not present

## 2016-10-13 DIAGNOSIS — Z6833 Body mass index (BMI) 33.0-33.9, adult: Secondary | ICD-10-CM | POA: Diagnosis not present

## 2016-10-13 DIAGNOSIS — Z0000 Encounter for general adult medical examination without abnormal findings: Secondary | ICD-10-CM | POA: Diagnosis not present

## 2016-10-13 DIAGNOSIS — E6609 Other obesity due to excess calories: Secondary | ICD-10-CM | POA: Diagnosis not present

## 2016-10-13 DIAGNOSIS — E7800 Pure hypercholesterolemia, unspecified: Secondary | ICD-10-CM | POA: Diagnosis not present

## 2016-10-17 DIAGNOSIS — R768 Other specified abnormal immunological findings in serum: Secondary | ICD-10-CM | POA: Diagnosis not present

## 2017-02-09 DIAGNOSIS — H40013 Open angle with borderline findings, low risk, bilateral: Secondary | ICD-10-CM | POA: Diagnosis not present

## 2017-02-09 DIAGNOSIS — H1013 Acute atopic conjunctivitis, bilateral: Secondary | ICD-10-CM | POA: Diagnosis not present

## 2017-02-09 DIAGNOSIS — H04123 Dry eye syndrome of bilateral lacrimal glands: Secondary | ICD-10-CM | POA: Diagnosis not present

## 2017-02-09 DIAGNOSIS — H40053 Ocular hypertension, bilateral: Secondary | ICD-10-CM | POA: Diagnosis not present

## 2017-05-25 DIAGNOSIS — H40013 Open angle with borderline findings, low risk, bilateral: Secondary | ICD-10-CM | POA: Diagnosis not present

## 2017-05-25 DIAGNOSIS — H35362 Drusen (degenerative) of macula, left eye: Secondary | ICD-10-CM | POA: Diagnosis not present

## 2017-05-25 DIAGNOSIS — H11443 Conjunctival cysts, bilateral: Secondary | ICD-10-CM | POA: Diagnosis not present

## 2017-05-25 DIAGNOSIS — H35033 Hypertensive retinopathy, bilateral: Secondary | ICD-10-CM | POA: Diagnosis not present

## 2017-08-03 DIAGNOSIS — H1013 Acute atopic conjunctivitis, bilateral: Secondary | ICD-10-CM | POA: Diagnosis not present

## 2017-08-03 DIAGNOSIS — H04123 Dry eye syndrome of bilateral lacrimal glands: Secondary | ICD-10-CM | POA: Diagnosis not present

## 2017-08-03 DIAGNOSIS — H11443 Conjunctival cysts, bilateral: Secondary | ICD-10-CM | POA: Diagnosis not present

## 2017-08-03 DIAGNOSIS — H16223 Keratoconjunctivitis sicca, not specified as Sjogren's, bilateral: Secondary | ICD-10-CM | POA: Diagnosis not present

## 2017-08-07 ENCOUNTER — Other Ambulatory Visit: Payer: Self-pay | Admitting: Family Medicine

## 2017-08-07 DIAGNOSIS — Z1231 Encounter for screening mammogram for malignant neoplasm of breast: Secondary | ICD-10-CM

## 2017-08-17 DIAGNOSIS — J029 Acute pharyngitis, unspecified: Secondary | ICD-10-CM | POA: Diagnosis not present

## 2017-09-02 ENCOUNTER — Ambulatory Visit
Admission: RE | Admit: 2017-09-02 | Discharge: 2017-09-02 | Disposition: A | Discharge status: Home / Self Care | Payer: Medicare Other | Source: Ambulatory Visit | Attending: Family Medicine | Admitting: Family Medicine

## 2017-09-02 DIAGNOSIS — Z1231 Encounter for screening mammogram for malignant neoplasm of breast: Secondary | ICD-10-CM | POA: Diagnosis not present

## 2017-11-02 DIAGNOSIS — Z0000 Encounter for general adult medical examination without abnormal findings: Secondary | ICD-10-CM | POA: Diagnosis not present

## 2017-11-02 DIAGNOSIS — E7800 Pure hypercholesterolemia, unspecified: Secondary | ICD-10-CM | POA: Diagnosis not present

## 2017-11-02 DIAGNOSIS — Z780 Asymptomatic menopausal state: Secondary | ICD-10-CM | POA: Diagnosis not present

## 2017-11-02 DIAGNOSIS — E559 Vitamin D deficiency, unspecified: Secondary | ICD-10-CM | POA: Diagnosis not present

## 2017-11-02 DIAGNOSIS — E6609 Other obesity due to excess calories: Secondary | ICD-10-CM | POA: Diagnosis not present

## 2017-11-02 DIAGNOSIS — I10 Essential (primary) hypertension: Secondary | ICD-10-CM | POA: Diagnosis not present

## 2017-11-02 DIAGNOSIS — Z6833 Body mass index (BMI) 33.0-33.9, adult: Secondary | ICD-10-CM | POA: Diagnosis not present

## 2017-11-05 ENCOUNTER — Other Ambulatory Visit: Payer: Self-pay | Admitting: Family Medicine

## 2017-11-05 DIAGNOSIS — E2839 Other primary ovarian failure: Secondary | ICD-10-CM

## 2017-11-05 DIAGNOSIS — Z780 Asymptomatic menopausal state: Secondary | ICD-10-CM

## 2017-11-05 DIAGNOSIS — E559 Vitamin D deficiency, unspecified: Secondary | ICD-10-CM

## 2017-11-09 DIAGNOSIS — Z23 Encounter for immunization: Secondary | ICD-10-CM | POA: Diagnosis not present

## 2018-02-08 DIAGNOSIS — H40013 Open angle with borderline findings, low risk, bilateral: Secondary | ICD-10-CM | POA: Diagnosis not present

## 2018-02-08 DIAGNOSIS — H1013 Acute atopic conjunctivitis, bilateral: Secondary | ICD-10-CM | POA: Diagnosis not present

## 2018-02-08 DIAGNOSIS — H16223 Keratoconjunctivitis sicca, not specified as Sjogren's, bilateral: Secondary | ICD-10-CM | POA: Diagnosis not present

## 2018-02-08 DIAGNOSIS — H04123 Dry eye syndrome of bilateral lacrimal glands: Secondary | ICD-10-CM | POA: Diagnosis not present

## 2018-02-19 DIAGNOSIS — J111 Influenza due to unidentified influenza virus with other respiratory manifestations: Secondary | ICD-10-CM | POA: Diagnosis not present

## 2018-02-19 DIAGNOSIS — R509 Fever, unspecified: Secondary | ICD-10-CM | POA: Diagnosis not present

## 2018-05-13 DIAGNOSIS — L089 Local infection of the skin and subcutaneous tissue, unspecified: Secondary | ICD-10-CM | POA: Diagnosis not present

## 2018-09-15 ENCOUNTER — Other Ambulatory Visit: Payer: Self-pay | Admitting: Family Medicine

## 2018-09-15 DIAGNOSIS — Z1231 Encounter for screening mammogram for malignant neoplasm of breast: Secondary | ICD-10-CM

## 2018-10-01 ENCOUNTER — Other Ambulatory Visit: Payer: Self-pay | Admitting: Family Medicine

## 2018-10-01 DIAGNOSIS — N6310 Unspecified lump in the right breast, unspecified quadrant: Secondary | ICD-10-CM

## 2018-10-01 DIAGNOSIS — N63 Unspecified lump in unspecified breast: Secondary | ICD-10-CM | POA: Diagnosis not present

## 2018-10-04 ENCOUNTER — Ambulatory Visit
Admission: RE | Admit: 2018-10-04 | Discharge: 2018-10-04 | Disposition: A | Discharge status: Home / Self Care | Payer: Medicare Other | Source: Ambulatory Visit | Attending: Family Medicine | Admitting: Family Medicine

## 2018-10-04 ENCOUNTER — Other Ambulatory Visit: Payer: Self-pay

## 2018-10-04 DIAGNOSIS — R928 Other abnormal and inconclusive findings on diagnostic imaging of breast: Secondary | ICD-10-CM | POA: Diagnosis not present

## 2018-10-04 DIAGNOSIS — N6310 Unspecified lump in the right breast, unspecified quadrant: Secondary | ICD-10-CM

## 2018-10-29 ENCOUNTER — Ambulatory Visit: Payer: Medicare Other

## 2018-11-09 DIAGNOSIS — Z1159 Encounter for screening for other viral diseases: Secondary | ICD-10-CM | POA: Diagnosis not present

## 2018-11-10 DIAGNOSIS — E7800 Pure hypercholesterolemia, unspecified: Secondary | ICD-10-CM | POA: Diagnosis not present

## 2018-11-10 DIAGNOSIS — G43109 Migraine with aura, not intractable, without status migrainosus: Secondary | ICD-10-CM | POA: Diagnosis not present

## 2018-11-10 DIAGNOSIS — Z0000 Encounter for general adult medical examination without abnormal findings: Secondary | ICD-10-CM | POA: Diagnosis not present

## 2018-11-10 DIAGNOSIS — I10 Essential (primary) hypertension: Secondary | ICD-10-CM | POA: Diagnosis not present

## 2018-11-12 DIAGNOSIS — Z1211 Encounter for screening for malignant neoplasm of colon: Secondary | ICD-10-CM | POA: Diagnosis not present

## 2018-11-12 DIAGNOSIS — D120 Benign neoplasm of cecum: Secondary | ICD-10-CM | POA: Diagnosis not present

## 2018-11-12 DIAGNOSIS — K635 Polyp of colon: Secondary | ICD-10-CM | POA: Diagnosis not present

## 2018-11-16 DIAGNOSIS — D120 Benign neoplasm of cecum: Secondary | ICD-10-CM | POA: Diagnosis not present

## 2018-11-16 DIAGNOSIS — K635 Polyp of colon: Secondary | ICD-10-CM | POA: Diagnosis not present

## 2018-12-01 DIAGNOSIS — I10 Essential (primary) hypertension: Secondary | ICD-10-CM | POA: Diagnosis not present

## 2018-12-01 DIAGNOSIS — M25531 Pain in right wrist: Secondary | ICD-10-CM | POA: Diagnosis not present

## 2018-12-01 DIAGNOSIS — F419 Anxiety disorder, unspecified: Secondary | ICD-10-CM | POA: Diagnosis not present

## 2018-12-17 DIAGNOSIS — I10 Essential (primary) hypertension: Secondary | ICD-10-CM | POA: Diagnosis not present

## 2018-12-17 DIAGNOSIS — Z23 Encounter for immunization: Secondary | ICD-10-CM | POA: Diagnosis not present

## 2018-12-17 DIAGNOSIS — F419 Anxiety disorder, unspecified: Secondary | ICD-10-CM | POA: Diagnosis not present

## 2019-02-08 ENCOUNTER — Ambulatory Visit: Payer: Medicare Other

## 2019-02-17 ENCOUNTER — Ambulatory Visit: Payer: Medicare PPO | Attending: Internal Medicine

## 2019-02-17 DIAGNOSIS — Z23 Encounter for immunization: Secondary | ICD-10-CM | POA: Insufficient documentation

## 2019-02-17 NOTE — Progress Notes (Signed)
   Covid-19 Vaccination Clinic  Name:  Kaitlyn Reed    MRN: [id number] DOB: [birth year]  02/17/2019  [name] was observed post Covid-19 immunization for 15 minutes without incidence. She was provided with Vaccine Information Sheet and instruction to access the V-Safe system.   [name] was instructed to call 911 with any severe reactions post vaccine: Marland Kitchen Difficulty breathing  . Swelling of your face and throat  . A fast heartbeat  . A bad rash all over your body  . Dizziness and weakness    Immunizations Administered    Name Date Dose VIS Date Route   Pfizer COVID-19 Vaccine 02/17/2019  5:22 PM 0.3 mL 12/24/2018 Intramuscular   Manufacturer: [place]   Lot: [id number]   [place]: [id number]

## 2019-02-19 ENCOUNTER — Ambulatory Visit: Payer: Medicare Other

## 2019-03-15 ENCOUNTER — Ambulatory Visit: Payer: Medicare PPO | Attending: Internal Medicine

## 2019-03-15 DIAGNOSIS — Z23 Encounter for immunization: Secondary | ICD-10-CM

## 2019-03-15 NOTE — Progress Notes (Signed)
   Covid-19 Vaccination Clinic  Name:  Kaitlyn Reed    MRN: [id number] DOB: 1950/11/06  03/15/2019  [name] was observed post Covid-19 immunization for 15 minutes without incident. She was provided with Vaccine Information Sheet and instruction to access the V-Safe system.   [name] was instructed to call 911 with any severe reactions post vaccine: Marland Kitchen Difficulty breathing  . Swelling of face and throat  . A fast heartbeat  . A bad rash all over body  . Dizziness and weakness   Immunizations Administered    Name Date Dose VIS Date Route   Pfizer COVID-19 Vaccine 03/15/2019  1:04 PM 0.3 mL 12/24/2018 Intramuscular   Manufacturer: [place]   Lot: [id number]   [place]: [id number]

## 2019-11-02 ENCOUNTER — Other Ambulatory Visit: Payer: Self-pay | Admitting: Family Medicine

## 2019-11-02 DIAGNOSIS — Z1231 Encounter for screening mammogram for malignant neoplasm of breast: Secondary | ICD-10-CM

## 2019-12-12 ENCOUNTER — Other Ambulatory Visit: Payer: Self-pay

## 2019-12-12 ENCOUNTER — Ambulatory Visit
Admission: RE | Admit: 2019-12-12 | Discharge: 2019-12-12 | Disposition: A | Discharge status: Home / Self Care | Payer: Medicare PPO | Source: Ambulatory Visit | Attending: Family Medicine | Admitting: Family Medicine

## 2019-12-12 DIAGNOSIS — Z1231 Encounter for screening mammogram for malignant neoplasm of breast: Secondary | ICD-10-CM

## 2019-12-23 ENCOUNTER — Other Ambulatory Visit: Payer: Self-pay | Admitting: Family Medicine

## 2019-12-23 DIAGNOSIS — Z780 Asymptomatic menopausal state: Secondary | ICD-10-CM

## 2020-01-25 DIAGNOSIS — Z20822 Contact with and (suspected) exposure to covid-19: Secondary | ICD-10-CM | POA: Diagnosis not present

## 2020-06-15 DIAGNOSIS — H2589 Other age-related cataract: Secondary | ICD-10-CM | POA: Diagnosis not present

## 2020-06-15 DIAGNOSIS — H40013 Open angle with borderline findings, low risk, bilateral: Secondary | ICD-10-CM | POA: Diagnosis not present

## 2020-06-15 DIAGNOSIS — H524 Presbyopia: Secondary | ICD-10-CM | POA: Diagnosis not present

## 2020-06-15 DIAGNOSIS — H43822 Vitreomacular adhesion, left eye: Secondary | ICD-10-CM | POA: Diagnosis not present

## 2020-06-15 DIAGNOSIS — H43812 Vitreous degeneration, left eye: Secondary | ICD-10-CM | POA: Diagnosis not present

## 2020-06-15 DIAGNOSIS — H35362 Drusen (degenerative) of macula, left eye: Secondary | ICD-10-CM | POA: Diagnosis not present

## 2020-06-15 DIAGNOSIS — H35372 Puckering of macula, left eye: Secondary | ICD-10-CM | POA: Diagnosis not present

## 2020-09-07 DIAGNOSIS — M1990 Unspecified osteoarthritis, unspecified site: Secondary | ICD-10-CM | POA: Insufficient documentation

## 2020-09-07 DIAGNOSIS — I10 Essential (primary) hypertension: Secondary | ICD-10-CM | POA: Insufficient documentation

## 2020-09-10 DIAGNOSIS — H02834 Dermatochalasis of left upper eyelid: Secondary | ICD-10-CM | POA: Diagnosis not present

## 2020-09-10 DIAGNOSIS — H02831 Dermatochalasis of right upper eyelid: Secondary | ICD-10-CM | POA: Diagnosis not present

## 2020-09-10 DIAGNOSIS — H02413 Mechanical ptosis of bilateral eyelids: Secondary | ICD-10-CM | POA: Diagnosis not present

## 2020-09-10 DIAGNOSIS — D485 Neoplasm of uncertain behavior of skin: Secondary | ICD-10-CM | POA: Diagnosis not present

## 2020-09-10 DIAGNOSIS — H57813 Brow ptosis, bilateral: Secondary | ICD-10-CM | POA: Diagnosis not present

## 2020-09-24 DIAGNOSIS — D2310 Other benign neoplasm of skin of unspecified eyelid, including canthus: Secondary | ICD-10-CM | POA: Diagnosis not present

## 2020-09-24 DIAGNOSIS — D485 Neoplasm of uncertain behavior of skin: Secondary | ICD-10-CM | POA: Diagnosis not present

## 2020-12-14 DIAGNOSIS — H40013 Open angle with borderline findings, low risk, bilateral: Secondary | ICD-10-CM | POA: Diagnosis not present

## 2020-12-19 DIAGNOSIS — Z0000 Encounter for general adult medical examination without abnormal findings: Secondary | ICD-10-CM | POA: Diagnosis not present

## 2020-12-19 DIAGNOSIS — I10 Essential (primary) hypertension: Secondary | ICD-10-CM | POA: Diagnosis not present

## 2020-12-19 DIAGNOSIS — E6609 Other obesity due to excess calories: Secondary | ICD-10-CM | POA: Diagnosis not present

## 2020-12-19 DIAGNOSIS — Z780 Asymptomatic menopausal state: Secondary | ICD-10-CM | POA: Diagnosis not present

## 2020-12-24 ENCOUNTER — Other Ambulatory Visit: Payer: Self-pay | Admitting: Family Medicine

## 2020-12-24 DIAGNOSIS — Z780 Asymptomatic menopausal state: Secondary | ICD-10-CM

## 2021-01-29 ENCOUNTER — Other Ambulatory Visit: Payer: Self-pay | Admitting: Family Medicine

## 2021-01-29 DIAGNOSIS — Z1231 Encounter for screening mammogram for malignant neoplasm of breast: Secondary | ICD-10-CM

## 2021-02-01 ENCOUNTER — Ambulatory Visit
Admission: RE | Admit: 2021-02-01 | Discharge: 2021-02-01 | Disposition: A | Discharge status: Home / Self Care | Payer: Medicare PPO | Source: Ambulatory Visit | Attending: Family Medicine | Admitting: Family Medicine

## 2021-02-01 ENCOUNTER — Other Ambulatory Visit: Payer: Self-pay

## 2021-02-01 DIAGNOSIS — Z780 Asymptomatic menopausal state: Secondary | ICD-10-CM

## 2021-02-01 DIAGNOSIS — Z1231 Encounter for screening mammogram for malignant neoplasm of breast: Secondary | ICD-10-CM | POA: Diagnosis not present

## 2021-02-01 DIAGNOSIS — M85832 Other specified disorders of bone density and structure, left forearm: Secondary | ICD-10-CM | POA: Diagnosis not present

## 2021-02-01 DIAGNOSIS — J019 Acute sinusitis, unspecified: Secondary | ICD-10-CM | POA: Diagnosis not present

## 2021-02-01 DIAGNOSIS — I10 Essential (primary) hypertension: Secondary | ICD-10-CM | POA: Diagnosis not present

## 2021-03-04 DIAGNOSIS — I10 Essential (primary) hypertension: Secondary | ICD-10-CM | POA: Diagnosis not present

## 2021-03-04 DIAGNOSIS — F419 Anxiety disorder, unspecified: Secondary | ICD-10-CM | POA: Diagnosis not present

## 2021-03-04 DIAGNOSIS — E559 Vitamin D deficiency, unspecified: Secondary | ICD-10-CM | POA: Diagnosis not present

## 2021-04-01 DIAGNOSIS — F419 Anxiety disorder, unspecified: Secondary | ICD-10-CM | POA: Diagnosis not present

## 2021-04-01 DIAGNOSIS — I10 Essential (primary) hypertension: Secondary | ICD-10-CM | POA: Diagnosis not present

## 2021-05-26 DIAGNOSIS — M25531 Pain in right wrist: Secondary | ICD-10-CM | POA: Diagnosis not present

## 2021-05-26 DIAGNOSIS — M79601 Pain in right arm: Secondary | ICD-10-CM | POA: Diagnosis not present

## 2021-05-31 DIAGNOSIS — M25531 Pain in right wrist: Secondary | ICD-10-CM | POA: Diagnosis not present

## 2021-06-05 DIAGNOSIS — M25531 Pain in right wrist: Secondary | ICD-10-CM | POA: Diagnosis not present

## 2021-07-29 DIAGNOSIS — H43393 Other vitreous opacities, bilateral: Secondary | ICD-10-CM | POA: Diagnosis not present

## 2021-07-29 DIAGNOSIS — H40013 Open angle with borderline findings, low risk, bilateral: Secondary | ICD-10-CM | POA: Diagnosis not present

## 2021-07-29 DIAGNOSIS — H524 Presbyopia: Secondary | ICD-10-CM | POA: Diagnosis not present

## 2021-07-29 DIAGNOSIS — H2513 Age-related nuclear cataract, bilateral: Secondary | ICD-10-CM | POA: Diagnosis not present

## 2021-10-21 DIAGNOSIS — D1801 Hemangioma of skin and subcutaneous tissue: Secondary | ICD-10-CM | POA: Diagnosis not present

## 2021-10-21 DIAGNOSIS — D3612 Benign neoplasm of peripheral nerves and autonomic nervous system, upper limb, including shoulder: Secondary | ICD-10-CM | POA: Diagnosis not present

## 2021-10-21 DIAGNOSIS — D2261 Melanocytic nevi of right upper limb, including shoulder: Secondary | ICD-10-CM | POA: Diagnosis not present

## 2021-10-21 DIAGNOSIS — D2262 Melanocytic nevi of left upper limb, including shoulder: Secondary | ICD-10-CM | POA: Diagnosis not present

## 2021-10-21 DIAGNOSIS — D2271 Melanocytic nevi of right lower limb, including hip: Secondary | ICD-10-CM | POA: Diagnosis not present

## 2021-10-21 DIAGNOSIS — D225 Melanocytic nevi of trunk: Secondary | ICD-10-CM | POA: Diagnosis not present

## 2021-10-21 DIAGNOSIS — D2372 Other benign neoplasm of skin of left lower limb, including hip: Secondary | ICD-10-CM | POA: Diagnosis not present

## 2021-10-21 DIAGNOSIS — L821 Other seborrheic keratosis: Secondary | ICD-10-CM | POA: Diagnosis not present

## 2021-10-21 DIAGNOSIS — D2272 Melanocytic nevi of left lower limb, including hip: Secondary | ICD-10-CM | POA: Diagnosis not present

## 2021-12-20 DIAGNOSIS — Z1389 Encounter for screening for other disorder: Secondary | ICD-10-CM | POA: Diagnosis not present

## 2021-12-20 DIAGNOSIS — I10 Essential (primary) hypertension: Secondary | ICD-10-CM | POA: Diagnosis not present

## 2021-12-20 DIAGNOSIS — Z6832 Body mass index (BMI) 32.0-32.9, adult: Secondary | ICD-10-CM | POA: Diagnosis not present

## 2021-12-20 DIAGNOSIS — Z0000 Encounter for general adult medical examination without abnormal findings: Secondary | ICD-10-CM | POA: Diagnosis not present

## 2021-12-23 DIAGNOSIS — H40013 Open angle with borderline findings, low risk, bilateral: Secondary | ICD-10-CM | POA: Diagnosis not present

## 2021-12-25 DIAGNOSIS — Z6832 Body mass index (BMI) 32.0-32.9, adult: Secondary | ICD-10-CM | POA: Diagnosis not present

## 2021-12-25 DIAGNOSIS — M8580 Other specified disorders of bone density and structure, unspecified site: Secondary | ICD-10-CM | POA: Diagnosis not present

## 2021-12-25 DIAGNOSIS — I10 Essential (primary) hypertension: Secondary | ICD-10-CM | POA: Diagnosis not present

## 2021-12-25 DIAGNOSIS — R42 Dizziness and giddiness: Secondary | ICD-10-CM | POA: Diagnosis not present

## 2021-12-25 DIAGNOSIS — E7800 Pure hypercholesterolemia, unspecified: Secondary | ICD-10-CM | POA: Diagnosis not present

## 2021-12-25 DIAGNOSIS — E6609 Other obesity due to excess calories: Secondary | ICD-10-CM | POA: Diagnosis not present

## 2022-02-07 ENCOUNTER — Other Ambulatory Visit: Payer: Self-pay | Admitting: Family Medicine

## 2022-02-07 DIAGNOSIS — Z1231 Encounter for screening mammogram for malignant neoplasm of breast: Secondary | ICD-10-CM

## 2022-02-27 ENCOUNTER — Ambulatory Visit
Admission: RE | Admit: 2022-02-27 | Discharge: 2022-02-27 | Disposition: A | Discharge status: Home / Self Care | Payer: Medicare PPO | Source: Ambulatory Visit | Attending: Family Medicine | Admitting: Family Medicine

## 2022-02-27 DIAGNOSIS — Z1231 Encounter for screening mammogram for malignant neoplasm of breast: Secondary | ICD-10-CM | POA: Diagnosis not present

## 2022-03-04 ENCOUNTER — Other Ambulatory Visit: Payer: Self-pay | Admitting: Family Medicine

## 2022-03-04 DIAGNOSIS — R928 Other abnormal and inconclusive findings on diagnostic imaging of breast: Secondary | ICD-10-CM

## 2022-03-11 ENCOUNTER — Ambulatory Visit: Payer: Medicare PPO

## 2022-03-13 ENCOUNTER — Ambulatory Visit
Admission: RE | Admit: 2022-03-13 | Discharge: 2022-03-13 | Disposition: A | Discharge status: Home / Self Care | Payer: Medicare PPO | Source: Ambulatory Visit | Attending: Family Medicine | Admitting: Family Medicine

## 2022-03-13 ENCOUNTER — Other Ambulatory Visit: Payer: Self-pay | Admitting: Family Medicine

## 2022-03-13 DIAGNOSIS — R928 Other abnormal and inconclusive findings on diagnostic imaging of breast: Secondary | ICD-10-CM

## 2022-03-13 DIAGNOSIS — N6310 Unspecified lump in the right breast, unspecified quadrant: Secondary | ICD-10-CM

## 2022-03-18 ENCOUNTER — Ambulatory Visit
Admission: RE | Admit: 2022-03-18 | Discharge: 2022-03-18 | Disposition: A | Discharge status: Home / Self Care | Payer: Medicare PPO | Source: Ambulatory Visit | Attending: Family Medicine | Admitting: Family Medicine

## 2022-03-18 DIAGNOSIS — N6310 Unspecified lump in the right breast, unspecified quadrant: Secondary | ICD-10-CM

## 2022-03-18 DIAGNOSIS — N6313 Unspecified lump in the right breast, lower outer quadrant: Secondary | ICD-10-CM | POA: Diagnosis not present

## 2022-03-18 DIAGNOSIS — C50511 Malignant neoplasm of lower-outer quadrant of right female breast: Secondary | ICD-10-CM | POA: Diagnosis not present

## 2022-03-18 HISTORY — PX: BREAST BIOPSY: SHX20

## 2022-03-20 ENCOUNTER — Telehealth: Payer: Self-pay | Admitting: Hematology and Oncology

## 2022-03-20 NOTE — Telephone Encounter (Signed)
Spoke to patient to confirm upcoming afternoon [facility] clinic appointment on 3/13, paperwork will be sent via mail/ e-mail.   Gave location and time, also informed patient that the surgeon's office would be calling as well to get information from them similar to the packet that they will be receiving so make sure to do both.  Reminded patient that all providers will be coming to the clinic to see them HERE and if they had any questions to not hesitate to reach back out to myself or their navigators.

## 2022-03-25 ENCOUNTER — Telehealth: Payer: Self-pay | Admitting: Hematology and Oncology

## 2022-03-25 ENCOUNTER — Other Ambulatory Visit: Payer: Self-pay

## 2022-03-25 ENCOUNTER — Encounter: Payer: Self-pay | Admitting: *Deleted

## 2022-03-25 DIAGNOSIS — C50511 Malignant neoplasm of lower-outer quadrant of right female breast: Secondary | ICD-10-CM

## 2022-03-25 NOTE — Telephone Encounter (Signed)
Patient called to get more information about appointment for 3/13 asked about number of people that was allowed to come and confirmed how doctors will be presented to patient, gave more details about appointment, patient voiced understanding

## 2022-03-25 NOTE — Progress Notes (Signed)
Radiation Oncology         [phone] ________________________________  Multidisciplinary Breast Oncology Clinic [facility]) Initial Outpatient Consultation  Name: Kaitlyn Reed MRN: [id number]  Date: 03/26/2022  DOB: 08/08/1950  [id number], Lattie Haw, MD  Jovita Kussmaul, MD   REFERRING PHYSICIAN: Autumn Messing III, MD  DIAGNOSIS: There were no encounter diagnoses.  Stage *** Right Breast LOQ, Invasive Ductal Carcinoma, ER+ / PR+ / Her2-, Grade 1  No diagnosis found.  HISTORY OF PRESENT ILLNESS::Kaitlyn Reed is a 72 y.o. female who is presenting to the office today for evaluation of her newly diagnosed right breast cancer. She is accompanied by ***. She is doing well overall.   She had routine screening mammography on 02/27/22 showing a possible abnormality in the right breast. She underwent unilateral right breast diagnostic mammography with tomography and right breast ultrasonography at [facility] on 03/13/22 showing:  a 9 x 5 x 8 mm irregular hypoechoic mass in the 8:30 o'clock right breast, 2 cmfn. No right axillary adenopathy was appreciated.  Biopsy of the 8:30 o'clock right breast on 03/18/22 showed: grade 1 invasive ductal carcinoma measuring 6 mm in the greatest linear extent of the sample. Prognostic indicators significant for: estrogen receptor, 95% positive and progesterone receptor, 95% positive, both with strong staining intensity. Proliferation marker Ki67 at 1%. HER2 negative.  Menarche: *** years old Age at first live birth: *** years old GP: *** LMP: *** Contraceptive: *** HRT: ***   The patient was referred today for presentation in the multidisciplinary conference.  Radiology studies and pathology slides were presented there for review and discussion of treatment options.  A consensus was discussed regarding potential next steps.  PREVIOUS RADIATION THERAPY: {EXAM; YES/NO:19492::"No"}  PAST MEDICAL HISTORY: No past medical history on file.  PAST  SURGICAL HISTORY: Past Surgical History:  Procedure Laterality Date   BREAST BIOPSY Right 03/18/2022   [place] RT BREAST BX W LOC DEV 1ST LESION IMG BX SPEC US GUIDE 03/18/2022 [facility]MO[facility]    FAMILY HISTORY:  Family History  Problem Relation Age of Onset   Breast cancer Neg Hx     SOCIAL HISTORY:  Social History   Socioeconomic History   Marital status: Married    Spouse name: Not on file   Number of children: Not on file   Years of education: Not on file   Highest education level: Not on file  Occupational History   Not on file  Tobacco Use   Smoking status: Not on file   Smokeless tobacco: Not on file  Substance and Sexual Activity   Alcohol use: Not on file   Drug use: Not on file   Sexual activity: Not on file  Other Topics Concern   Not on file  Social History Narrative   Not on file   Social Determinants of Health   Financial Resource Strain: Not on file  Food Insecurity: Not on file  Transportation Needs: Not on file  Physical Activity: Not on file  Stress: Not on file  Social Connections: Not on file    ALLERGIES: Not on File  MEDICATIONS:  No current outpatient medications on file.   No current facility-administered medications for this encounter.    REVIEW OF SYSTEMS: A 10+ POINT REVIEW OF SYSTEMS WAS OBTAINED including neurology, dermatology, psychiatry, cardiac, respiratory, lymph, extremities, GI, GU, musculoskeletal, constitutional, reproductive, HEENT. On the provided form, she reports ***. She denies *** and any other symptoms.    PHYSICAL EXAM:  vitals were  not taken for this visit.  {may need to copy over vitals} Lungs are clear to auscultation bilaterally. Heart has regular rate and rhythm. No palpable cervical, supraclavicular, or axillary adenopathy. Abdomen soft, non-tender, normal bowel sounds. Breast: *** breast with no palpable mass, nipple discharge, or bleeding. *** breast with ***.   KPS = ***  100 - Normal; no complaints; no  evidence of disease. 90   - Able to carry on normal activity; minor signs or symptoms of disease. 80   - Normal activity with effort; some signs or symptoms of disease. 2   - Cares for self; unable to carry on normal activity or to do active work. 60   - Requires occasional assistance, but is able to care for most of his personal needs. 50   - Requires considerable assistance and frequent medical care. 53   - Disabled; requires special care and assistance. 23   - Severely disabled; hospital admission is indicated although death not imminent. 8   - Very sick; hospital admission necessary; active supportive treatment necessary. 10   - Moribund; fatal processes progressing rapidly. 0     - Dead  Karnofsky DA, Abelmann Robbins, Craver LS and Burchenal [facility] [phone]) The use of the nitrogen mustards in the palliative treatment of carcinoma: with particular reference to bronchogenic carcinoma Cancer 1 [phone]  LABORATORY DATA:  Lab Results  Component Value Date   WBC 8.2 03/11/2010   HGB 12.8 03/11/2010   HCT 38.4 03/11/2010   MCV 88.9 03/11/2010   PLT 218 03/11/2010   Lab Results  Component Value Date   NA 139 03/11/2010   K 3.2 (L) 03/11/2010   CL 104 03/11/2010   CO2 27 03/11/2010   Lab Results  Component Value Date   ALT 17 03/11/2010   AST 23 03/11/2010   ALKPHOS 55 03/11/2010   BILITOT 0.5 03/11/2010    PULMONARY FUNCTION TEST:   Review Flowsheet        No data to display          RADIOGRAPHY: [place] RT BREAST BX W LOC DEV 1ST LESION IMG BX SPEC US GUIDE  Addendum Date: 03/20/2022   ADDENDUM REPORT: 03/20/2022 07:29 ADDENDUM: Pathology revealed GRADE 1 INVASIVE DUCTAL CARCINOMA of the RIGHT breast, 8:30 o'clock, 2 cmfn, (coil clip). This was found to be concordant by Dr. Dorise Bullion. Pathology results were discussed with the patient by telephone. The patient reported doing well after the biopsy with tenderness at the site. Post biopsy instructions and care were reviewed and  questions were answered. The patient was encouraged to call [facility] for any additional concerns. The patient was referred to [facility] on March 26, 2022, per patient request. Pathology results reported by Stacie Acres RN on 03/19/2022. Electronically Signed   By: Dorise Bullion III M.D.   On: 03/20/2022 07:29   Result Date: 03/20/2022 CLINICAL DATA:  Biopsy of a right breast mass EXAM: ULTRASOUND GUIDED RIGHT BREAST CORE NEEDLE BIOPSY COMPARISON:  Previous exam(s). PROCEDURE: I met with the patient and we discussed the procedure of ultrasound-guided biopsy, including benefits and alternatives. We discussed the high likelihood of a successful procedure. We discussed the risks of the procedure, including infection, bleeding, tissue injury, clip migration, and inadequate sampling. Informed written consent was given. The usual time-out protocol was performed immediately prior to the procedure. Lesion quadrant: 830 right breast Using sterile technique and 1% Lidocaine  as local anesthetic, under direct ultrasound visualization, a 14 gauge spring-loaded device was used to perform biopsy of an 830 right breast mass using a lateral approach. At the conclusion of the procedure a coil shaped tissue marker clip was deployed into the biopsy cavity. Follow up 2 view mammogram was performed and dictated separately. IMPRESSION: Ultrasound guided biopsy of a right breast mass as above. No apparent complications. Electronically Signed: By: Dorise Bullion III M.D. On: 03/18/2022 09:32  MM CLIP PLACEMENT RIGHT  Result Date: 03/18/2022 CLINICAL DATA:  Evaluate biopsy marker EXAM: 3D DIAGNOSTIC RIGHT MAMMOGRAM POST ULTRASOUND BIOPSY COMPARISON:  Previous exam(s). FINDINGS: 3D Mammographic images were obtained following ultrasound guided biopsy of a right breast mass. The biopsy marking clip is in the location of the biopsied  mass. IMPRESSION: Appropriate positioning of the coil shaped biopsy marking clip at the site of biopsy in the breast immediately adjacent to the biopsied mass. Final Assessment: Post Procedure Mammograms for Marker Placement Electronically Signed   By: Dorise Bullion III M.D.   On: 03/18/2022 09:42  MM DIAG BREAST TOMO UNI RIGHT  Result Date: 03/13/2022 CLINICAL DATA:  Patient recalled from screening for right breast distortion EXAM: DIGITAL DIAGNOSTIC UNILATERAL RIGHT MAMMOGRAM WITH TOMOSYNTHESIS; ULTRASOUND RIGHT BREAST LIMITED TECHNIQUE: Right digital diagnostic mammography and breast tomosynthesis was performed.; Targeted ultrasound examination of the right breast was performed COMPARISON:  Previous exam(s). ACR Breast Density Category b: There are scattered areas of fibroglandular density. FINDINGS: Within the anterolateral right breast there is a persistent small irregular mass, further evaluated with spot compression views. Targeted ultrasound is performed, showing a 9 x 5 x 8 mm irregular hypoechoic mass right breast 8:30 o'clock 2 cm from the nipple. No right axillary adenopathy. IMPRESSION: Suspicious right breast mass 8:30 o'clock. RECOMMENDATION: Ultrasound-guided core needle biopsy right breast mass 8:30 o'clock. I have discussed the findings and recommendations with the patient. If applicable, a reminder letter will be sent to the patient regarding the next appointment. BI-RADS CATEGORY  4: Suspicious. Electronically Signed   By: Lovey Newcomer M.D.   On: 03/13/2022 15:07  US BREAST LTD UNI RIGHT INC AXILLA  Result Date: 03/13/2022 CLINICAL DATA:  Patient recalled from screening for right breast distortion EXAM: DIGITAL DIAGNOSTIC UNILATERAL RIGHT MAMMOGRAM WITH TOMOSYNTHESIS; ULTRASOUND RIGHT BREAST LIMITED TECHNIQUE: Right digital diagnostic mammography and breast tomosynthesis was performed.; Targeted ultrasound examination of the right breast was performed COMPARISON:  Previous exam(s). ACR  Breast Density Category b: There are scattered areas of fibroglandular density. FINDINGS: Within the anterolateral right breast there is a persistent small irregular mass, further evaluated with spot compression views. Targeted ultrasound is performed, showing a 9 x 5 x 8 mm irregular hypoechoic mass right breast 8:30 o'clock 2 cm from the nipple. No right axillary adenopathy. IMPRESSION: Suspicious right breast mass 8:30 o'clock. RECOMMENDATION: Ultrasound-guided core needle biopsy right breast mass 8:30 o'clock. I have discussed the findings and recommendations with the patient. If applicable, a reminder letter will be sent to the patient regarding the next appointment. BI-RADS CATEGORY  4: Suspicious. Electronically Signed   By: Lovey Newcomer M.D.   On: 03/13/2022 15:07  MM 3D SCREEN BREAST BILATERAL  Result Date: 03/03/2022 CLINICAL DATA:  Screening. EXAM: DIGITAL SCREENING BILATERAL MAMMOGRAM WITH TOMOSYNTHESIS AND CAD TECHNIQUE: Bilateral screening digital craniocaudal and mediolateral oblique mammograms were obtained. Bilateral screening digital breast tomosynthesis was performed. The images were evaluated with computer-aided detection. COMPARISON:  Previous exam(s). ACR Breast Density Category b: There are scattered areas of  fibroglandular density. FINDINGS: In the right breast, a possible asymmetry/distortion warrants further evaluation. This possible asymmetry/distortion is seen within the outer RIGHT breast, cc slice 64 and MLO slice 72. In the left breast, no findings suspicious for malignancy. IMPRESSION: Further evaluation is suggested for possible asymmetry/distortion in the right breast. RECOMMENDATION: Diagnostic mammogram and possibly ultrasound of the right breast. (Code:[id number]) The patient will be contacted regarding the findings, and additional imaging will be scheduled. BI-RADS CATEGORY  0: Incomplete: Need additional imaging evaluation. Electronically Signed   By: Franki Cabot M.D.   On:  03/03/2022 16:00      IMPRESSION: *** {DIAGNOSIS HERE}  Patient will be a good candidate for breast conservation with radiotherapy to the right breast. We discussed the general course of radiation, potential side effects, and toxicities with radiation and the patient is interested in this approach. ***   PLAN:  *** (copy notes from board at conference)   ------------------------------------------------  Blair Promise, PhD, MD  This document serves as a record of services personally performed by Gery Pray, MD. It was created on his behalf by Roney Mans, a trained medical scribe. The creation of this record is based on the scribe's personal observations and the provider's statements to them. This document has been checked and approved by the attending provider.

## 2022-03-26 ENCOUNTER — Inpatient Hospital Stay: Payer: Medicare PPO | Attending: Hematology

## 2022-03-26 ENCOUNTER — Encounter: Payer: Self-pay | Admitting: Hematology

## 2022-03-26 ENCOUNTER — Encounter: Payer: Self-pay | Admitting: *Deleted

## 2022-03-26 ENCOUNTER — Ambulatory Visit: Payer: Self-pay | Admitting: General Surgery

## 2022-03-26 ENCOUNTER — Inpatient Hospital Stay (HOSPITAL_BASED_OUTPATIENT_CLINIC_OR_DEPARTMENT_OTHER): Payer: Medicare PPO | Admitting: Genetic Counselor

## 2022-03-26 ENCOUNTER — Encounter: Payer: Self-pay | Admitting: General Practice

## 2022-03-26 ENCOUNTER — Ambulatory Visit: Payer: Medicare PPO | Admitting: Physical Therapy

## 2022-03-26 ENCOUNTER — Inpatient Hospital Stay (HOSPITAL_BASED_OUTPATIENT_CLINIC_OR_DEPARTMENT_OTHER): Payer: Medicare PPO | Admitting: Hematology

## 2022-03-26 ENCOUNTER — Ambulatory Visit
Admission: RE | Admit: 2022-03-26 | Discharge: 2022-03-26 | Disposition: A | Discharge status: Home / Self Care | Payer: Medicare PPO | Source: Ambulatory Visit | Attending: Radiation Oncology | Admitting: Radiation Oncology

## 2022-03-26 VITALS — BP 159/70 | HR 71 | Temp 98.0°F | Resp 18 | Ht 64.0 in | Wt 196.2 lb

## 2022-03-26 DIAGNOSIS — Z170 Estrogen receptor positive status [ER+]: Secondary | ICD-10-CM

## 2022-03-26 DIAGNOSIS — Z90710 Acquired absence of both cervix and uterus: Secondary | ICD-10-CM | POA: Diagnosis not present

## 2022-03-26 DIAGNOSIS — Z9049 Acquired absence of other specified parts of digestive tract: Secondary | ICD-10-CM | POA: Insufficient documentation

## 2022-03-26 DIAGNOSIS — I10 Essential (primary) hypertension: Secondary | ICD-10-CM | POA: Diagnosis not present

## 2022-03-26 DIAGNOSIS — Z87891 Personal history of nicotine dependence: Secondary | ICD-10-CM

## 2022-03-26 DIAGNOSIS — Z808 Family history of malignant neoplasm of other organs or systems: Secondary | ICD-10-CM | POA: Diagnosis not present

## 2022-03-26 DIAGNOSIS — Z800 Family history of malignant neoplasm of digestive organs: Secondary | ICD-10-CM | POA: Diagnosis not present

## 2022-03-26 DIAGNOSIS — C50511 Malignant neoplasm of lower-outer quadrant of right female breast: Secondary | ICD-10-CM | POA: Insufficient documentation

## 2022-03-26 DIAGNOSIS — M25559 Pain in unspecified hip: Secondary | ICD-10-CM | POA: Diagnosis not present

## 2022-03-26 DIAGNOSIS — Z801 Family history of malignant neoplasm of trachea, bronchus and lung: Secondary | ICD-10-CM | POA: Diagnosis not present

## 2022-03-26 DIAGNOSIS — Z79899 Other long term (current) drug therapy: Secondary | ICD-10-CM

## 2022-03-26 DIAGNOSIS — M25519 Pain in unspecified shoulder: Secondary | ICD-10-CM | POA: Insufficient documentation

## 2022-03-26 DIAGNOSIS — M8580 Other specified disorders of bone density and structure, unspecified site: Secondary | ICD-10-CM | POA: Insufficient documentation

## 2022-03-26 LAB — CBC WITH DIFFERENTIAL (CANCER CENTER ONLY)
Abs Immature Granulocytes: 0.03 10*3/uL (ref 0.00–0.07)
Basophils Absolute: 0.1 10*3/uL (ref 0.0–0.1)
Basophils Relative: 1 %
Eosinophils Absolute: 0.3 10*3/uL (ref 0.0–0.5)
Eosinophils Relative: 3 %
HCT: 39.7 % (ref 36.0–46.0)
Hemoglobin: 13.7 g/dL (ref 12.0–15.0)
Immature Granulocytes: 0 %
Lymphocytes Relative: 24 %
Lymphs Abs: 2.5 10*3/uL (ref 0.7–4.0)
MCH: 30.8 pg (ref 26.0–34.0)
MCHC: 34.5 g/dL (ref 30.0–36.0)
MCV: 89.2 fL (ref 80.0–100.0)
Monocytes Absolute: 0.5 10*3/uL (ref 0.1–1.0)
Monocytes Relative: 5 %
Neutro Abs: 7.1 10*3/uL (ref 1.7–7.7)
Neutrophils Relative %: 67 %
Platelet Count: 251 10*3/uL (ref 150–400)
RBC: 4.45 MIL/uL (ref 3.87–5.11)
RDW: 13 % (ref 11.5–15.5)
WBC Count: 10.5 10*3/uL (ref 4.0–10.5)
nRBC: 0 % (ref 0.0–0.2)

## 2022-03-26 LAB — CMP (CANCER CENTER ONLY)
ALT: 19 U/L (ref 0–44)
AST: 25 U/L (ref 15–41)
Albumin: 4.5 g/dL (ref 3.5–5.0)
Alkaline Phosphatase: 68 U/L (ref 38–126)
Anion gap: 6 (ref 5–15)
BUN: 15 mg/dL (ref 8–23)
CO2: 33 mmol/L — ABNORMAL HIGH (ref 22–32)
Calcium: 10.1 mg/dL (ref 8.9–10.3)
Chloride: 102 mmol/L (ref 98–111)
Creatinine: 0.73 mg/dL (ref 0.44–1.00)
GFR, Estimated: >60 mL/min (ref >60)
Glucose, Bld: 97 mg/dL (ref 70–99)
Potassium: 4 mmol/L (ref 3.5–5.1)
Sodium: 141 mmol/L (ref 135–145)
Total Bilirubin: 0.5 mg/dL (ref 0.3–1.2)
Total Protein: 8.1 g/dL (ref 6.5–8.1)

## 2022-03-26 LAB — GENETIC SCREENING ORDER

## 2022-03-26 MED ORDER — KETOROLAC TROMETHAMINE 15 MG/ML IJ SOLN: 15.0000 mg | Freq: Once | INTRAMUSCULAR | Status: AC

## 2022-03-26 NOTE — Progress Notes (Signed)
Kaitlyn Reed   [phone]) [phone] Fax:(336) [place] Note   Patient Care Team: Kathyrn Lass, MD as PCP - General (Family Medicine) Jovita Kussmaul, MD as Consulting Physician (General Surgery) Truitt Merle, MD as Consulting Physician (Hematology) Gery Pray, MD as Consulting Physician (Radiation Oncology) Mauro Kaufmann, RN as Oncology Nurse Navigator Rockwell Germany, RN as Oncology Nurse Navigator  Date of Service:  03/26/2022   CHIEF COMPLAINTS/PURPOSE OF CONSULTATION:  Right  Breast Cancer, ER+  REFERRING PHYSICIAN:  [facility]   ASSESSMENT & PLAN:  Kaitlyn Reed is a 72 y.o. hysterectomy female with a history of    1.Malignant neoplasm of lower-outer quadrant of right breast of female, estrogen receptor positive ([place])  --We discussed her imaging findings and the biopsy results in great details. -Given the early stage disease, she likely need a lumpectomy . She is agreeable with that. She was seen by Dr. Marlou Starks today and likely will proceed with surgery soon.  -Her tumor is more than 1 cm on her surgical pathology, I recommend a Oncotype Dx test and we'll make a decision about adjuvant chemotherapy based on the Oncotype result. Written material of this test was given to her. She has overall good health  and fit, would be a good candidate for chemotherapy if her Oncotype recurrence score is high.  Given her strong ER and PR expression, low-grade, I suspect that this is likely low risk disease. -Giving the strong ER and PR expression in her postmenopausal status, I recommend adjuvant endocrine therapy with aromatase inhibitor for a total of 5-10 years to reduce the risk of cancer recurrence. Potential benefits and side effects were discussed with patient and she is interested. -She was also seen by radiation oncologist Dr. Sondra Come today. She will likely benefit from breast radiation if she undergo lumpectomy to decrease the risk of  breast cancer.  -We also discussed the breast cancer surveillance after her surgery. She will continue annual screening mammogram, self exam, and a routine office visit with lab and exam with [place]. -I encouraged her to have healthy diet and exercise regularly.   2. Bone Health, osteopenia Her most recent DEXA was 02/01/2021 showing osteopenia with T score -1.1 in forarm, no high risk for fracture  -We discussed the impact of anticoagulant therapy on her bone density.  PLAN:  -Patient will proceed with right lumpectomy with Dr. Marlou Starks soon -If her tumor is more than 1 cm on her surgical pathology, will obtain Oncotype Dx to see if she needs adjuvant radiation -She will likely proceed adjuvant radiation after surgery -I will see her at the end of her radiation, or sooner if needed.   Oncology History Overview Note   Cancer Staging  Malignant neoplasm of lower-outer quadrant of right breast of female, estrogen receptor positive ([place]) Staging form: Breast, AJCC 8th Edition - Clinical stage from 03/18/2022: Stage IA (cT1b, cN0, cM0, G1, ER+, PR+, HER2-) - Signed by Truitt Merle, MD on 03/26/2022 Stage prefix: Initial diagnosis Histologic grading system: 3 grade system     Malignant neoplasm of lower-outer quadrant of right breast of female, estrogen receptor positive ([place])  03/18/2022 Cancer Staging   Staging form: Breast, AJCC 8th Edition - Clinical stage from 03/18/2022: Stage IA (cT1b, cN0, cM0, G1, ER+, PR+, HER2-) - Signed by Truitt Merle, MD on 03/26/2022 Stage prefix: Initial diagnosis Histologic grading system: 3 grade system   03/25/2022 Initial Diagnosis   Malignant neoplasm of lower-outer quadrant  of right breast of female, estrogen receptor positive ([place])      HISTORY OF PRESENTING ILLNESS:  Kaitlyn Reed 72 y.o. female is a here because of breast cancer. The patient was [facility] by [facility]. The patient presents to the clinic today accompanied by family.   She had routine  screening mammography on 02/27/2022 showing a possible abnormality in the right breast.she denies palpable breast lump, skin changes or nipple discharge.  She underwent right diagnostic mammography and  breast ultrasonography on 03/13/2022 showing a 9 x 5 x 8 mm irregular hypoechoic mass right breast 8:30 o'clock 2 cm from the nipple. No right axillary adenopathy.   . Biopsy on 03/18/2022 showed grade 1 invasive ductal carcinoma, estrogen receptor, 95% positive and progesterone receptor, 95% positive . Proliferation marker Ki67 at 1%. HER2 negative.  Patient denies any new symptoms lately. pt state that she has lot joint pain in her hip and shoulder. Pt reports she doesn't take anything for pain.  She is physically active, lives with her husband.  She has a past medical history of hypertension.   Family history is positive for multiple myeloma in her brother.  No other history of thyroid tumor.  She is married, has 3 children.   GYN HISTORY  Menarchal:12 [id number] hysterectomy Contraceptive: HRT: no GP:G3,P3    REVIEW OF SYSTEMS:    Constitutional: Denies fevers, chills or abnormal night sweats Eyes: Denies blurriness of vision, double vision or watery eyes Ears, nose, mouth, throat, and face: Denies mucositis or sore throat Respiratory: Denies cough, dyspnea or wheezes Cardiovascular: Denies palpitation, chest discomfort or lower extremity swelling Gastrointestinal:  Denies nausea, heartburn or change in bowel habits Skin: Denies abnormal skin rashes Lymphatics: Denies new lymphadenopathy or easy bruising Neurological:Denies numbness, tingling or new weaknesses Behavioral/Psych: Mood is stable, no new changes  All other systems were reviewed with the patient and are negative.   MEDICAL HISTORY:  Past Medical History:  Diagnosis Date   Hypertension     SURGICAL HISTORY: Past Surgical History:  Procedure Laterality Date   ABDOMINAL HYSTERECTOMY     BREAST BIOPSY Right  03/18/2022   [place] RT BREAST BX W LOC DEV 1ST LESION IMG BX SPEC US GUIDE 03/18/2022 [facility]   CESAREAN SECTION     CHOLECYSTECTOMY     PARTIAL HYSTERECTOMY      SOCIAL HISTORY: Social History   Socioeconomic History   Marital status: Married    Spouse name: Not on file   Number of children: 3   Years of education: Not on file   Highest education level: Not on file  Occupational History   Not on file  Tobacco Use   Smoking status: Former    Packs/day: 1.00    Years: 20.00    Total pack years: 20.00    Types: Cigarettes    Quit date: 1990    Years since quitting: 34.2   Smokeless tobacco: Not on file  Substance and Sexual Activity   Alcohol use: Yes    Comment: 4-6 glasses wine /week   Drug use: Never   Sexual activity: Not on file  Other Topics Concern   Not on file  Social History Narrative   Not on file   Social Determinants of Health   Financial Resource Strain: Not on file  Food Insecurity: Not on file  Transportation Needs: Not on file  Physical Activity: Not on file  Stress: Not on file  Social Connections: Not on file  Intimate Partner  Violence: Not on file    FAMILY HISTORY: Family History  Problem Relation Age of Onset   Cancer Brother        multiple myeloma   Colon cancer Paternal Grandmother    Lung cancer Paternal Grandfather    Breast cancer Neg Hx     ALLERGIES:  has no allergies on file.  MEDICATIONS:  No current outpatient medications on file.   No current facility-administered medications for this visit.   Facility-Administered Medications Ordered in Other Visits  Medication Dose Route Frequency Provider Last Rate Last Admin   ketorolac (TORADOL) 15 MG/ML injection 15 mg  15 mg Intravenous Once Autumn Messing III, MD        PHYSICAL EXAMINATION: ECOG PERFORMANCE STATUS: 0 - Asymptomatic  Vitals:   03/26/22 1240  BP: (!) 159/70  Pulse: 71  Resp: 18  Temp: 98 F (36.7 C)  SpO2: 98%   Filed Weights   03/26/22 1240   Weight: 196 lb 3.2 oz (89 kg)      NECK: (-)supple, thyroid normal size, non-tender, without nodularity LYMPH: (-) no palpable lymphadenopathy in the cervical, axillary  ABDOMEN:(-) abdomen soft, non-tender and normal bowel sounds BREAST: rt breast biopsy area there is a small palpable lumpy. No other palpable mass, nodules or adenopathy Lt Breast exam benign.      LABORATORY DATA:  I have reviewed the data as listed    Latest Ref Rng & Units 03/26/2022   12:00 PM 03/11/2010    7:45 AM 05/31/2008   12:01 AM  CBC  WBC 4.0 - 10.5 K/uL 10.5  8.2  13.4   Hemoglobin 12.0 - 15.0 g/dL 13.7  12.8  12.9   Hematocrit 36.0 - 46.0 % 39.7  38.4  37.9   Platelets 150 - 400 K/uL 251  218  215        Latest Ref Rng & Units 03/26/2022   12:00 PM 03/11/2010    7:45 AM 05/31/2008   12:01 AM  CMP  Glucose 70 - 99 mg/dL 97  144  134   BUN 8 - 23 mg/dL '15  14  18   '$ Creatinine 0.44 - 1.00 mg/dL 0.73  0.66  0.8   Sodium 135 - 145 mmol/L 141  139  144   Potassium 3.5 - 5.1 mmol/L 4.0  3.2  3.2   Chloride 98 - 111 mmol/L 102  104  100   CO2 22 - 32 mmol/L 33  27  32   Calcium 8.9 - 10.3 mg/dL 10.1  8.9  9.4   Total Protein 6.5 - 8.1 g/dL 8.1  7.5  7.9   Total Bilirubin 0.3 - 1.2 mg/dL 0.5  0.5  0.3   Alkaline Phos 38 - 126 U/L 68  55  78   AST 15 - 41 U/L '25  23  25   '$ ALT 0 - 44 U/L '19  17  12      '$ RADIOGRAPHIC STUDIES: I have personally reviewed the radiological images as listed and agreed with the findings in the report. [place] RT BREAST BX W LOC DEV 1ST LESION IMG BX SPEC US GUIDE  Addendum Date: 03/20/2022   ADDENDUM REPORT: 03/20/2022 07:29 ADDENDUM: Pathology revealed GRADE 1 INVASIVE DUCTAL CARCINOMA of the RIGHT breast, 8:30 o'clock, 2 cmfn, (coil clip). This was found to be concordant by Dr. Dorise Bullion. Pathology results were discussed with the patient by telephone. The patient reported doing well after the biopsy with tenderness at the site. Post  biopsy instructions and care were  reviewed and questions were answered. The patient was encouraged to call [facility] for any additional concerns. The patient was referred to [facility] on March 26, 2022, per patient request. Pathology results reported by Stacie Acres RN on 03/19/2022. Electronically Signed   By: Dorise Bullion III M.D.   On: 03/20/2022 07:29   Result Date: 03/20/2022 CLINICAL DATA:  Biopsy of a right breast mass EXAM: ULTRASOUND GUIDED RIGHT BREAST CORE NEEDLE BIOPSY COMPARISON:  Previous exam(s). PROCEDURE: I met with the patient and we discussed the procedure of ultrasound-guided biopsy, including benefits and alternatives. We discussed the high likelihood of a successful procedure. We discussed the risks of the procedure, including infection, bleeding, tissue injury, clip migration, and inadequate sampling. Informed written consent was given. The usual time-out protocol was performed immediately prior to the procedure. Lesion quadrant: 830 right breast Using sterile technique and 1% Lidocaine as local anesthetic, under direct ultrasound visualization, a 14 gauge spring-loaded device was used to perform biopsy of an 830 right breast mass using a lateral approach. At the conclusion of the procedure a coil shaped tissue marker clip was deployed into the biopsy cavity. Follow up 2 view mammogram was performed and dictated separately. IMPRESSION: Ultrasound guided biopsy of a right breast mass as above. No apparent complications. Electronically Signed: By: Dorise Bullion III M.D. On: 03/18/2022 09:32  MM CLIP PLACEMENT RIGHT  Result Date: 03/18/2022 CLINICAL DATA:  Evaluate biopsy marker EXAM: 3D DIAGNOSTIC RIGHT MAMMOGRAM POST ULTRASOUND BIOPSY COMPARISON:  Previous exam(s). FINDINGS: 3D Mammographic images were obtained following ultrasound guided biopsy of a right breast mass. The biopsy marking clip is in the location of  the biopsied mass. IMPRESSION: Appropriate positioning of the coil shaped biopsy marking clip at the site of biopsy in the breast immediately adjacent to the biopsied mass. Final Assessment: Post Procedure Mammograms for Marker Placement Electronically Signed   By: Dorise Bullion III M.D.   On: 03/18/2022 09:42  MM DIAG BREAST TOMO UNI RIGHT  Result Date: 03/13/2022 CLINICAL DATA:  Patient recalled from screening for right breast distortion EXAM: DIGITAL DIAGNOSTIC UNILATERAL RIGHT MAMMOGRAM WITH TOMOSYNTHESIS; ULTRASOUND RIGHT BREAST LIMITED TECHNIQUE: Right digital diagnostic mammography and breast tomosynthesis was performed.; Targeted ultrasound examination of the right breast was performed COMPARISON:  Previous exam(s). ACR Breast Density Category b: There are scattered areas of fibroglandular density. FINDINGS: Within the anterolateral right breast there is a persistent small irregular mass, further evaluated with spot compression views. Targeted ultrasound is performed, showing a 9 x 5 x 8 mm irregular hypoechoic mass right breast 8:30 o'clock 2 cm from the nipple. No right axillary adenopathy. IMPRESSION: Suspicious right breast mass 8:30 o'clock. RECOMMENDATION: Ultrasound-guided core needle biopsy right breast mass 8:30 o'clock. I have discussed the findings and recommendations with the patient. If applicable, a reminder letter will be sent to the patient regarding the next appointment. BI-RADS CATEGORY  4: Suspicious. Electronically Signed   By: Lovey Newcomer M.D.   On: 03/13/2022 15:07  US BREAST LTD UNI RIGHT INC AXILLA  Result Date: 03/13/2022 CLINICAL DATA:  Patient recalled from screening for right breast distortion EXAM: DIGITAL DIAGNOSTIC UNILATERAL RIGHT MAMMOGRAM WITH TOMOSYNTHESIS; ULTRASOUND RIGHT BREAST LIMITED TECHNIQUE: Right digital diagnostic mammography and breast tomosynthesis was performed.; Targeted ultrasound examination of the right breast was performed COMPARISON:  Previous  exam(s). ACR Breast Density Category b: There are scattered areas of  fibroglandular density. FINDINGS: Within the anterolateral right breast there is a persistent small irregular mass, further evaluated with spot compression views. Targeted ultrasound is performed, showing a 9 x 5 x 8 mm irregular hypoechoic mass right breast 8:30 o'clock 2 cm from the nipple. No right axillary adenopathy. IMPRESSION: Suspicious right breast mass 8:30 o'clock. RECOMMENDATION: Ultrasound-guided core needle biopsy right breast mass 8:30 o'clock. I have discussed the findings and recommendations with the patient. If applicable, a reminder letter will be sent to the patient regarding the next appointment. BI-RADS CATEGORY  4: Suspicious. Electronically Signed   By: Lovey Newcomer M.D.   On: 03/13/2022 15:07  MM 3D SCREEN BREAST BILATERAL  Result Date: 03/03/2022 CLINICAL DATA:  Screening. EXAM: DIGITAL SCREENING BILATERAL MAMMOGRAM WITH TOMOSYNTHESIS AND CAD TECHNIQUE: Bilateral screening digital craniocaudal and mediolateral oblique mammograms were obtained. Bilateral screening digital breast tomosynthesis was performed. The images were evaluated with computer-aided detection. COMPARISON:  Previous exam(s). ACR Breast Density Category b: There are scattered areas of fibroglandular density. FINDINGS: In the right breast, a possible asymmetry/distortion warrants further evaluation. This possible asymmetry/distortion is seen within the outer RIGHT breast, cc slice 64 and MLO slice 72. In the left breast, no findings suspicious for malignancy. IMPRESSION: Further evaluation is suggested for possible asymmetry/distortion in the right breast. RECOMMENDATION: Diagnostic mammogram and possibly ultrasound of the right breast. (Code:[id number]) The patient will be contacted regarding the findings, and additional imaging will be scheduled. BI-RADS CATEGORY  0: Incomplete: Need additional imaging evaluation. Electronically Signed   By: Franki Cabot M.D.   On: 03/03/2022 16:00     No orders of the defined types were placed in this encounter.   All questions were answered. The patient knows to call the clinic with any problems, questions or concerns. The total time spent in the appointment was 45 minutes.     Truitt Merle, MD 03/26/2022 7:03 PM  I, Audry Riles, am acting as scribe for Truitt Merle, MD.   I have reviewed the above documentation for accuracy and completeness, and I agree with the above.

## 2022-03-26 NOTE — Progress Notes (Signed)
[facility] Psychosocial Distress Screening Spiritual Care  Met with Kaitlyn Reed), her husband, and daughters [name] and [name] in [facility] to introduce [place] team/resources, reviewing distress screen per protocol.  The patient scored a 6 on the Psychosocial Distress Thermometer which indicates moderate distress. Also assessed for distress and other psychosocial needs.      03/26/2022    4:37 PM  ONCBCN DISTRESS SCREENING  Screening Type Initial Screening  Distress experienced in past week (1-10) 6  Emotional problem type Nervousness/Anxiety;Adjusting to illness  Spiritual/Religous concerns type Facing my mortality  Referral to support programs Yes    Chaplain and patient discussed common feelings and emotions when being diagnosed with cancer, and the importance of support during treatment.  Chaplain informed patient of the support team and support services at [facility].  Chaplain provided contact information and encouraged patient to call with any questions or concerns.  [name] was very upbeat and relieved about the news she received today. Her family, including her five grandchildren, is a strong source of support and meaning for her.  Follow up needed: Yes.  Plan to follow up by phone in ca two weeks and will check in about interest in [employer] peer mentor at that time.   [place], [place], [facility] Pager [phone] Voicemail [phone]

## 2022-03-26 NOTE — Research (Signed)
Trial: [employer] 2021-05 - Specimen Collection Study to Evaluate Biomarkers in Subjects with Cancer   Patient Kaitlyn Reed was identified by Dr Burr Medico as a potential candidate for the above listed study.  This Clinical Research Nurse met with Kaitlyn Reed, [id number], on 03/26/22 in a manner and location that ensures patient privacy to discuss participation in the above listed research study.  Patient is Accompanied by her family .  A copy of the informed consent document with embedded HIPAA language was provided to the patient.  Patient reads, speaks, and understands [place].    Patient was provided with the business card of this Nurse and encouraged to contact the research team with any questions.  Patient was provided the option of taking informed consent documents home to review and was encouraged to review at their convenience with their support network, including other care providers. Patient is comfortable with making a decision regarding study participation today.  As outlined in the informed consent form, this Nurse and Sebastian Ache discussed the purpose of the research study, the investigational nature of the study, study procedures and requirements for study participation, potential risks and benefits of study participation, as well as alternatives to participation. This study is not blinded. The patient understands participation is voluntary and they may withdraw from study participation at any time.  This study does not involve randomization.  This study does not involve an investigational drug or device. This study does not involve a placebo. Patient understands enrollment is pending full eligibility review.   Confidentiality and how the patient's information will be used as part of study participation were discussed.  Patient was informed there is reimbursement provided for their time and effort spent on trial participation.  The patient is encouraged to discuss  research study participation with their insurance provider to determine what costs they may incur as part of study participation, including research related injury.    All questions were answered to patient's satisfaction.  The informed consent with embedded HIPAA language was reviewed page by page.  The patient's mental and emotional status is appropriate to provide informed consent, and the patient verbalizes an understanding of study participation.  Patient has agreed to participate in the above listed research study and has voluntarily signed the informed consent version date 11 Feb 2021 with embedded HIPAA language, version date 11 Feb 2021  on 03/26/22 at 3:00PM.  The patient was provided with a copy of the signed informed consent form with embedded HIPAA language for their reference.  No study specific procedures were obtained prior to the signing of the informed consent document.  Approximately 15 minutes were spent with the patient reviewing the informed consent documents.  Patient has chosen to not complete a Release of Information form at this time.  Patient will return to [facility] on Friday 03/28/22 at 1000 for research labs and to receive her gift card.  Marjie Skiff Armoni Depass, RN, BSN, [facility] She  Her  Hers Clinical Research Nurse [facility] Direct Dial [phone]  Pager [phone] 03/26/2022 4:09 PM

## 2022-03-27 ENCOUNTER — Encounter: Payer: Self-pay | Admitting: Genetic Counselor

## 2022-03-27 NOTE — Progress Notes (Signed)
REFERRING PROVIDER: Truitt Merle, MD  PRIMARY PROVIDER:  Kathyrn Lass, MD  PRIMARY REASON FOR VISIT:  1. Malignant neoplasm of lower-outer quadrant of right breast of female, estrogen receptor positive ([place])     HISTORY OF PRESENT ILLNESS:   Ms. Addicott, a 72 y.o. female, was seen for a [place] cancer genetics consultation at the request of Dr. Burr Medico due to a personal and family history of cancer. Ms. Dahlman presents to clinic today to discuss the possibility of a hereditary predisposition to cancer, to discuss genetic testing, and to further clarify her future cancer risks, as well as potential cancer risks for family members.   In March 2024, at the age of 46, Ms. 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   CANCER HISTORY:  Oncology History Overview Note   Cancer Staging  Malignant neoplasm of lower-outer quadrant of right breast of female, estrogen receptor positive ([place]) Staging form: Breast, AJCC 8th Edition - Clinical stage from 03/18/2022: Stage IA (cT1b, cN0, cM0, G1, ER+, PR+, HER2-) - Signed by Truitt Merle, MD on 03/26/2022 Stage prefix: Initial diagnosis Histologic grading system: 3 grade system     Malignant neoplasm of lower-outer quadrant of right breast of female, estrogen receptor positive ([place])  03/18/2022 Cancer Staging   Staging form: Breast, AJCC 8th Edition - Clinical stage from 03/18/2022: Stage IA (cT1b, cN0, cM0, G1, ER+, PR+, HER2-) - Signed by Truitt Merle, MD on 03/26/2022 Stage prefix: Initial diagnosis Histologic grading system: 3 grade system   03/25/2022 Initial Diagnosis   Malignant neoplasm of lower-outer quadrant of right breast of female, estrogen receptor positive ([place])      Past Medical History:  Diagnosis Date   Hypertension     Past Surgical History:  Procedure Laterality Date   ABDOMINAL HYSTERECTOMY     BREAST BIOPSY Right 03/18/2022   [place] RT BREAST BX W LOC DEV 1ST LESION IMG  BX [place] US GUIDE 03/18/2022 [facility] MAMMOGRAPHY   CESAREAN SECTION     CHOLECYSTECTOMY     PARTIAL HYSTERECTOMY      Social History   Socioeconomic History   Marital status: Married    Spouse name: Not on file   Number of children: 3   Years of education: Not on file   Highest education level: Not on file  Occupational History   Not on file  Tobacco Use   Smoking status: Former    Packs/day: 1.00    Years: 20.00    Additional pack years: 0.00    Total pack years: 20.00    Types: Cigarettes    Quit date: 9    Years since quitting: 34.2   Smokeless tobacco: Not on file  Substance and Sexual Activity   Alcohol use: Yes    Comment: 4-6 glasses wine /week   Drug use: Never   Sexual activity: Not on file  Other Topics Concern   Not on file  Social History Narrative   Not on file   Social Determinants of Health   Financial Resource Strain: Not on file  Food Insecurity: Not on file  Transportation Needs: Not on file  Physical Activity: Not on file  Stress: Not on file  Social Connections: Not on file     FAMILY HISTORY:  We obtained a detailed, 4-generation family history.  Significant diagnoses are listed below: Family History  Problem Relation Age of Onset   Multiple myeloma Brother 12   Colon cancer Paternal Grandmother 7 -  2   Lung cancer Paternal Grandfather    Breast cancer Cousin 36 - 72       maternal first cousin   Cancer Cousin        maternal first cousin, unknown type   Melanoma Daughter 47       dx. again at age 78   Cervical cancer Daughter 42     Ms. Peron has two daughters. One daughter was diagnosed with melanoma at age 61 and again at age 66. Her second daughter was diagnosed with cervical cancer at age 76. Ms. Mitton brother was diagnosed with multiple myeloma at age 29. One paternal first cousin was diagnosed with breast cancer in her 37s and died in her late 41s and a second paternal first cousin was diagnosed with cancer (unknown  type) at an unknown age, he died at age 100. Ms. Course paternal grandmother was diagnosed with colon cancer in her 6s and died at age 35. Her paternal grandfather was diagnosed with lung cancer at an unknown age, he is deceased. Ms. Sherba is unaware of previous family history of genetic testing for hereditary cancer risks. There is no reported Ashkenazi Jewish ancestry.   GENETIC COUNSELING ASSESSMENT: Ms. Fetsch is a 72 y.o. female with a personal and family history of cancer which is somewhat suggestive of a hereditary predisposition to cancer. We, therefore, discussed and recommended the following at today's visit.   DISCUSSION: We discussed that 5 - 10% of cancer is hereditary, with most cases of breast cancer associated with BRCA1/2.  There are other genes that can be associated with hereditary breast cancer syndromes.  We discussed that testing is beneficial for several reasons including knowing how to follow individuals after completing their treatment, identifying whether potential treatment options would be beneficial, and understanding if other family members could be at risk for cancer and allowing them to undergo genetic testing.   We reviewed the characteristics, features and inheritance patterns of hereditary cancer syndromes. We also discussed genetic testing, including the appropriate family members to test, the process of testing, insurance coverage and turn-around-time for results. We discussed the implications of a negative, positive, carrier and/or variant of uncertain significant result. We recommended Ms. Hallenbeck pursue genetic testing for a panel that includes genes associated with breast cancer, colon cancer, and melanoma.   Ms. Hares elected to have [facility] Custom Panel. The Custom Hereditary Cancers Panel offered by [facility] includes sequencing and/or deletion duplication testing of the following 45 genes: APC, ATM, AXIN2, BAP1, BARD1, BMPR1A, BRCA1, BRCA2, BRIP1, CDH1,  CDK4, CDKN2A (p14ARF and p16INK4a only), CHEK2, CTNNA1, EPCAM (Deletion/duplication testing only), FH, GREM1 (promoter region duplication testing only), HOXB13, KIT, MBD4, MEN1, MITF, MLH1, MSH2, MSH3, MSH6, MUTYH, NF1, NHTL1, PALB2, PDGFRA, PMS2, POLD1, POLE, POT1, PTEN, RAD51C, RAD51D, SMAD4, SMARCA4. STK11, TP53, TSC1, TSC2, and VHL.  Based on Ms. Belay's personal and family history of cancer, she meets medical criteria for genetic testing. Despite that she meets criteria, she may still have an out of pocket cost. We discussed that if her out of pocket cost for testing is over $100, the laboratory will call and confirm whether she wants to proceed with testing.  If the out of pocket cost of testing is less than $100 she will be billed by the genetic testing laboratory.   PLAN: After considering the risks, benefits, and limitations, Ms. Kindel provided informed consent to pursue genetic testing and the blood sample was sent to [facility] for analysis of the Custom Panel.  Results should be available within approximately 2-3 weeks' time, at which point they will be disclosed by telephone to Ms. Butch, as will any additional recommendations warranted by these results. Ms. Deslandes will receive a summary of her genetic counseling visit and a copy of her results once available. This information will also be available in Epic.   Ms. Castonguay questions were answered to her satisfaction today. Our contact information was provided should additional questions or concerns arise. Thank you for the referral and allowing [place] to share in the care of your patient.   Lucille Passy, MS, [facility] Genetic Counselor [email or web address] (P) [phone]  The patient was seen for a total of 20 minutes in face-to-face genetic counseling.  The patient brought her husband and two daughters. Drs. Lindi Adie and/or Burr Medico were available to discuss this case as needed.    _______________________________________________________________________ For Office Staff:  Number of people involved in session: 4 Was an Intern/ student involved with case: no

## 2022-03-28 ENCOUNTER — Other Ambulatory Visit: Payer: Self-pay

## 2022-03-28 ENCOUNTER — Inpatient Hospital Stay: Payer: Medicare PPO

## 2022-03-28 ENCOUNTER — Other Ambulatory Visit: Payer: Self-pay | Admitting: General Surgery

## 2022-03-28 DIAGNOSIS — I10 Essential (primary) hypertension: Secondary | ICD-10-CM | POA: Diagnosis not present

## 2022-03-28 DIAGNOSIS — M25559 Pain in unspecified hip: Secondary | ICD-10-CM | POA: Diagnosis not present

## 2022-03-28 DIAGNOSIS — M8580 Other specified disorders of bone density and structure, unspecified site: Secondary | ICD-10-CM | POA: Diagnosis not present

## 2022-03-28 DIAGNOSIS — Z170 Estrogen receptor positive status [ER+]: Secondary | ICD-10-CM

## 2022-03-28 DIAGNOSIS — Z9049 Acquired absence of other specified parts of digestive tract: Secondary | ICD-10-CM | POA: Diagnosis not present

## 2022-03-28 DIAGNOSIS — Z79899 Other long term (current) drug therapy: Secondary | ICD-10-CM | POA: Diagnosis not present

## 2022-03-28 DIAGNOSIS — C50511 Malignant neoplasm of lower-outer quadrant of right female breast: Secondary | ICD-10-CM | POA: Diagnosis not present

## 2022-03-28 DIAGNOSIS — M25519 Pain in unspecified shoulder: Secondary | ICD-10-CM | POA: Diagnosis not present

## 2022-03-28 DIAGNOSIS — Z87891 Personal history of nicotine dependence: Secondary | ICD-10-CM | POA: Diagnosis not present

## 2022-03-28 LAB — CBC WITH DIFFERENTIAL (CANCER CENTER ONLY)
Abs Immature Granulocytes: 0.02 10*3/uL (ref 0.00–0.07)
Basophils Absolute: 0.1 10*3/uL (ref 0.0–0.1)
Basophils Relative: 1 %
Eosinophils Absolute: 0.3 10*3/uL (ref 0.0–0.5)
Eosinophils Relative: 4 %
HCT: 37 % (ref 36.0–46.0)
Hemoglobin: 13.1 g/dL (ref 12.0–15.0)
Immature Granulocytes: 0 %
Lymphocytes Relative: 33 %
Lymphs Abs: 2.3 10*3/uL (ref 0.7–4.0)
MCH: 30.8 pg (ref 26.0–34.0)
MCHC: 35.4 g/dL (ref 30.0–36.0)
MCV: 86.9 fL (ref 80.0–100.0)
Monocytes Absolute: 0.4 10*3/uL (ref 0.1–1.0)
Monocytes Relative: 6 %
Neutro Abs: 4 10*3/uL (ref 1.7–7.7)
Neutrophils Relative %: 56 %
Platelet Count: 239 10*3/uL (ref 150–400)
RBC: 4.26 MIL/uL (ref 3.87–5.11)
RDW: 13.2 % (ref 11.5–15.5)
WBC Count: 7.1 10*3/uL (ref 4.0–10.5)
nRBC: 0 % (ref 0.0–0.2)

## 2022-03-28 LAB — CMP (CANCER CENTER ONLY)
ALT: 19 U/L (ref 0–44)
AST: 26 U/L (ref 15–41)
Albumin: 4.4 g/dL (ref 3.5–5.0)
Alkaline Phosphatase: 64 U/L (ref 38–126)
Anion gap: 7 (ref 5–15)
BUN: 17 mg/dL (ref 8–23)
CO2: 29 mmol/L (ref 22–32)
Calcium: 9.9 mg/dL (ref 8.9–10.3)
Chloride: 104 mmol/L (ref 98–111)
Creatinine: 0.64 mg/dL (ref 0.44–1.00)
GFR, Estimated: >60 mL/min (ref >60)
Glucose, Bld: 110 mg/dL — ABNORMAL HIGH (ref 70–99)
Potassium: 3.8 mmol/L (ref 3.5–5.1)
Sodium: 140 mmol/L (ref 135–145)
Total Bilirubin: 0.5 mg/dL (ref 0.3–1.2)
Total Protein: 7.8 g/dL (ref 6.5–8.1)

## 2022-03-28 LAB — RESEARCH LABS

## 2022-03-28 NOTE — Research (Signed)
[employer] 2021-05 - Specimen Collection Study to Evaluate Biomarkers in Subjects with Cancer    This Nurse has reviewed this patient's inclusion and exclusion criteria and confirmed Kaitlyn Reed is eligible for study participation.  Patient will continue with enrollment.  Eligibility confirmed by treating investigator, who also agrees that patient should proceed with enrollment.  Marjie Skiff Vika Buske, RN, BSN, [facility] She  Her  Hers Clinical Research Nurse [facility] Direct Dial [phone]  Pager [phone] 03/28/2022 8:35 AM

## 2022-03-28 NOTE — Research (Signed)
[employer] 2021-05 - Specimen Collection Study to Evaluate Biomarkers in Subjects with Cancer    This Coordinator has reviewed this patient's inclusion and exclusion criteria as a second review and confirms Kaitlyn Reed is eligible for study participation.  Patient may continue with enrollment.   Johny Drilling, [facility] 03/28/2022 8:37 AM

## 2022-04-03 ENCOUNTER — Encounter: Payer: Self-pay | Admitting: *Deleted

## 2022-04-03 ENCOUNTER — Telehealth: Payer: Self-pay | Admitting: *Deleted

## 2022-04-03 NOTE — Telephone Encounter (Signed)
Spoke with patient to follow up from [facility] 3/13 and assess navigation needs. Patient denies any questions or concerns at this time. Encouraged her to call should anything arise. Patient verbalized understanding.

## 2022-04-09 ENCOUNTER — Encounter: Payer: Self-pay | Admitting: Genetic Counselor

## 2022-04-09 ENCOUNTER — Telehealth: Payer: Self-pay | Admitting: Genetic Counselor

## 2022-04-09 ENCOUNTER — Encounter: Payer: Self-pay | Admitting: General Practice

## 2022-04-09 DIAGNOSIS — Z1379 Encounter for other screening for genetic and chromosomal anomalies: Secondary | ICD-10-CM | POA: Insufficient documentation

## 2022-04-09 NOTE — Telephone Encounter (Signed)
I contacted Ms. Sittner to discuss her genetic testing results. No pathogenic variants were identified in the 45 genes analyzed. Detailed clinic note to follow.  The test report has been scanned into EPIC and is located under the Molecular Pathology section of the Results Review tab.  A portion of the result report is included below for reference.   Lucille Passy, MS, [facility] Genetic Counselor [email or web address] (P) [phone]

## 2022-04-09 NOTE — Progress Notes (Signed)
[place] Spiritual Care Note  [place] by phone for [facility] follow-up support. She continues to be upbeat and grateful about the scope of her diagnosis, her wealth of support, and the team caring for her. [name] is planning her big annual family Ivor [place] celebration, which is a huge source of joy for her, and plans to contact chaplain sometime afterward if she decides she would like an Pharmacist, hospital referral.   Suzette Battiest, [place], [facility] Pager [phone] Voicemail [phone]

## 2022-04-11 ENCOUNTER — Encounter: Payer: Self-pay | Admitting: Genetic Counselor

## 2022-04-11 ENCOUNTER — Ambulatory Visit: Payer: Self-pay | Admitting: Genetic Counselor

## 2022-04-11 DIAGNOSIS — Z1379 Encounter for other screening for genetic and chromosomal anomalies: Secondary | ICD-10-CM

## 2022-04-11 DIAGNOSIS — C50511 Malignant neoplasm of lower-outer quadrant of right female breast: Secondary | ICD-10-CM

## 2022-04-11 NOTE — Progress Notes (Signed)
HPI:   Kaitlyn Reed was previously seen in the [facility] clinic due to a personal and family history of cancer and concerns regarding a hereditary predisposition to cancer. Please refer to our prior cancer genetics clinic note for more information regarding our discussion, assessment and recommendations, at the time. Kaitlyn Reed recent genetic test results were disclosed to her, as were recommendations warranted by these results. These results and recommendations are discussed in more detail below.  CANCER HISTORY:  Oncology History Overview Note   Cancer Staging  Malignant neoplasm of lower-outer quadrant of right breast of female, estrogen receptor positive ([place]) Staging form: Breast, AJCC 8th Edition - Clinical stage from 03/18/2022: Stage IA (cT1b, cN0, cM0, G1, ER+, PR+, HER2-) - Signed by Truitt Merle, MD on 03/26/2022 Stage prefix: Initial diagnosis Histologic grading system: 3 grade system     Malignant neoplasm of lower-outer quadrant of right breast of female, estrogen receptor positive ([place])  03/18/2022 Cancer Staging   Staging form: Breast, AJCC 8th Edition - Clinical stage from 03/18/2022: Stage IA (cT1b, cN0, cM0, G1, ER+, PR+, HER2-) - Signed by Truitt Merle, MD on 03/26/2022 Stage prefix: Initial diagnosis Histologic grading system: 3 grade system   03/25/2022 Initial Diagnosis   Malignant neoplasm of lower-outer quadrant of right breast of female, estrogen receptor positive ([place])    Genetic Testing   [facility] Custom Cancer Panel+RNA was Negative. Report date is 04/01/2022.   The Custom Hereditary Cancers Panel offered by [facility] includes sequencing and/or deletion duplication testing of the following 45 genes: APC, ATM, AXIN2, BAP1, BARD1, BMPR1A, BRCA1, BRCA2, BRIP1, CDH1, CDK4, CDKN2A (p14ARF and p16INK4a only), CHEK2, CTNNA1, EPCAM (Deletion/duplication testing only), FH, GREM1 (promoter region duplication testing only), HOXB13, KIT, MBD4, MEN1, MITF, MLH1, MSH2,  MSH3, MSH6, MUTYH, NF1, NHTL1, PALB2, PDGFRA, PMS2, POLD1, POLE, POT1, PTEN, RAD51C, RAD51D, SMAD4, SMARCA4. STK11, TP53, TSC1, TSC2, and VHL.     FAMILY HISTORY:  We obtained a detailed, 4-generation family history.  Significant diagnoses are listed below:      Family History  Problem Relation Age of Onset   Multiple myeloma Brother 24   Colon cancer Paternal Grandmother 61 - 31   Lung cancer Paternal Grandfather     Breast cancer Cousin 61 - 62        maternal first cousin   Cancer Cousin          maternal first cousin, unknown type   Melanoma Daughter 48        dx. again at age 5   Cervical cancer Daughter 26       Kaitlyn Reed has two daughters. One daughter was diagnosed with melanoma at age 42 and again at age 78. Her second daughter was diagnosed with cervical cancer at age 69. Kaitlyn Reed brother was diagnosed with multiple myeloma at age 91. One paternal first cousin was diagnosed with breast cancer in her 60s and died in her late 26s and a second paternal first cousin was diagnosed with cancer (unknown type) at an unknown age, he died at age 75. Kaitlyn Reed paternal grandmother was diagnosed with colon cancer in her 4s and died at age 65. Her paternal grandfather was diagnosed with lung cancer at an unknown age, he is deceased. Kaitlyn Reed is unaware of previous family history of genetic testing for hereditary cancer risks. There is no reported Ashkenazi Jewish ancestry.   GENETIC TEST RESULTS:  The [facility] Custom Panel found no pathogenic mutations.  The Custom Hereditary Cancers Panel  offered by [facility] includes sequencing and/or deletion duplication testing of the following 45 genes: APC, ATM, AXIN2, BAP1, BARD1, BMPR1A, BRCA1, BRCA2, BRIP1, CDH1, CDK4, CDKN2A (p14ARF and p16INK4a only), CHEK2, CTNNA1, EPCAM (Deletion/duplication testing only), FH, GREM1 (promoter region duplication testing only), HOXB13, KIT, MBD4, MEN1, MITF, MLH1, MSH2, MSH3, MSH6, MUTYH, NF1, NHTL1,  PALB2, PDGFRA, PMS2, POLD1, POLE, POT1, PTEN, RAD51C, RAD51D, SMAD4, SMARCA4. STK11, TP53, TSC1, TSC2, and VHL.   The test report has been scanned into EPIC and is located under the Molecular Pathology section of the Results Review tab.  A portion of the result report is included below for reference. Genetic testing reported out on 04/01/2022.       Even though a pathogenic variant was not identified, possible explanations for the cancer in the family may include: There may be no hereditary risk for cancer in the family. The cancers in Kaitlyn Reed and/or her family may be due to other genetic or environmental factors. There may be a gene mutation in one of these genes that current testing methods cannot detect, but that chance is small. There could be another gene that has not yet been discovered, or that we have not yet tested, that is responsible for the cancer diagnoses in the family.   Therefore, it is important to remain in touch with cancer genetics in the future so that we can continue to offer Kaitlyn Reed the most up to date genetic testing.    ADDITIONAL GENETIC TESTING:  We discussed with Kaitlyn Reed that her genetic testing was fairly extensive.  If there are genes identified to increase cancer risk that can be analyzed in the future, we would be happy to discuss and coordinate this testing at that time.    CANCER SCREENING RECOMMENDATIONS:  Kaitlyn Reed test result is considered negative (normal).  This means that we have not identified a hereditary cause for her personal and family history of cancer at this time.   An individual's cancer risk and medical management are not determined by genetic test results alone. Overall cancer risk assessment incorporates additional factors, including personal medical history, family history, and any available genetic information that may result in a personalized plan for cancer prevention and surveillance. Therefore, it is recommended she  continue to follow the cancer management and screening guidelines provided by her oncology and primary healthcare provider.  RECOMMENDATIONS FOR FAMILY MEMBERS:   Since she did not inherit a mutation in a cancer predisposition gene included on this panel, her children could not have inherited a mutation from her in one of these genes. Individuals in this family might be at some increased risk of developing cancer, over the general population risk, due to the family history of cancer. We recommend women in this family have a yearly mammogram beginning at age 21, or 52 years younger than the earliest onset of cancer, an annual clinical breast exam, and perform monthly breast self-exams.  FOLLOW-UP:  Cancer genetics is a rapidly advancing field and it is possible that new genetic tests will be appropriate for her and/or her family members in the future. We encouraged her to remain in contact with cancer genetics on an annual basis so we can update her personal and family histories and let her know of advances in cancer genetics that may benefit this family.   Our contact number was provided. Kaitlyn Reed questions were answered to her satisfaction, and she knows she is welcome to call us at anytime with additional questions or concerns.  Lucille Passy, MS, [facility] Genetic Counselor [email or web address] (P) [phone]

## 2022-04-22 NOTE — Progress Notes (Signed)
Surgical Instructions    Your procedure is scheduled on Wednesday April 30, 2022.  Report to [facility] Main Entrance "A" at 6:30 A.M., then check in with the Admitting office.  Call this number if you have problems the morning of surgery:  [phone]   If you have any questions prior to your surgery date call [phone]: Open Monday-Friday 8am-4pm If you experience any cold or flu symptoms such as cough, fever, chills, shortness of breath, etc. between now and your scheduled surgery, please notify us at the above number     Remember:  Do not eat after midnight the night before your surgery  You may drink clear liquids until 5:30 A.M the morning of your surgery.   Clear liquids allowed are: Water, Non-Citrus Juices (without pulp), Carbonated Beverages, Clear Tea, Black Coffee ONLY (NO MILK, CREAM OR POWDERED CREAMER of any kind), and Gatorade    Take these medicines the morning of surgery with A SIP OF WATER:  metoprolol succinate (TOPROL-XL)   Follow your surgeon's instructions on when to stop Aspirin.  If no instructions were given by your surgeon then you will need to call the office to get those instructions.    As of today, STOP taking any Aspirin (unless otherwise instructed by your surgeon) Aleve, Naproxen, Ibuprofen, Motrin, Advil, Goody's, BC's, all herbal medications, fish oil, and all vitamins.  SURGICAL WAITING ROOM VISITATION Patients having surgery or a procedure may have no more than 2 support people in the waiting area - these visitors may rotate.   Children under the age of 49 must have an adult with them who is not the patient. If the patient needs to stay at the hospital during part of their recovery, the visitor guidelines for inpatient rooms apply. Pre-op nurse will coordinate an appropriate time for 1 support person to accompany patient in pre-op.  This support person may not rotate.   Please refer to  [email or web address] for the visitor guidelines for Inpatients (after your surgery is over and you are in a regular room).    Special instructions:    Oral Hygiene is also important to reduce your risk of infection.  Remember - BRUSH YOUR TEETH THE MORNING OF SURGERY WITH YOUR REGULAR TOOTHPASTE   - Preparing For Surgery  Before surgery, you can play an important role. Because skin is not sterile, your skin needs to be as free of germs as possible. You can reduce the number of germs on your skin by washing with CHG (chlorahexidine gluconate) Soap before surgery.  CHG is an antiseptic cleaner which kills germs and bonds with the skin to continue killing germs even after washing.     Please do not use if you have an allergy to CHG or antibacterial soaps. If your skin becomes reddened/irritated stop using the CHG.  Do not shave (including legs and underarms) for at least 48 hours prior to first CHG shower. It is OK to shave your face.  Please follow these instructions carefully.     Shower the NIGHT BEFORE SURGERY and the MORNING OF SURGERY with CHG Soap.   If you chose to wash your hair, wash your hair first as usual with your normal shampoo. After you shampoo, rinse your hair and body thoroughly to remove the shampoo.  Then [employer] and genitals (private parts) with your normal soap and rinse thoroughly to remove soap.  After that Use CHG Soap as you would any other liquid soap. You can apply CHG directly to the  skin and wash gently with a scrungie or a clean washcloth.   Apply the CHG Soap to your body ONLY FROM THE NECK DOWN.  Do not use on open wounds or open sores. Avoid contact with your eyes, ears, mouth and genitals (private parts). Wash Face and genitals (private parts)  with your normal soap.   Wash thoroughly, paying special attention to the area where your surgery will be performed.  Thoroughly rinse your body with  warm water from the neck down.  DO NOT shower/wash with your normal soap after using and rinsing off the CHG Soap.  Pat yourself dry with a CLEAN TOWEL.  Wear CLEAN PAJAMAS to bed the night before surgery  Place CLEAN SHEETS on your bed the night before your surgery  DO NOT SLEEP WITH PETS.   Day of Surgery:  Take a shower with CHG soap. Wear Clean/Comfortable clothing the morning of surgery Do not apply any deodorants/lotions.   Remember to brush your teeth WITH YOUR REGULAR TOOTHPASTE.  Do not wear jewelry or makeup. Do not wear lotions, powders, perfumes/cologne or deodorant. Do not shave 48 hours prior to surgery.  Men may shave face and neck. Do not bring valuables to the hospital. Do not wear nail polish, gel polish, artificial nails, or any other type of covering on natural nails (fingers and toes) If you have artificial nails or gel coating that need to be removed by a nail salon, please have this removed prior to surgery. Artificial nails or gel coating may interfere with anesthesia's ability to adequately monitor your vital signs.  [facility] is not responsible for any belongings or valuables.    Do NOT Smoke (Tobacco/Vaping)  24 hours prior to your procedure  If you use a CPAP at night, you may bring your mask for your overnight stay.   Contacts, glasses, hearing aids, dentures or partials may not be worn into surgery, please bring cases for these belongings   For patients admitted to the hospital, discharge time will be determined by your treatment team.   Patients discharged the day of surgery will not be allowed to drive home, and someone needs to stay with them for 24 hours.   If you received a COVID test during your pre-op visit, it is requested that you wear a mask when out in public, stay away from anyone that may not be feeling well, and notify your surgeon if you develop symptoms. If you have been in contact with anyone that has tested positive in the last  10 days, please notify your surgeon.    Please read over the following fact sheets that you were given.

## 2022-04-23 ENCOUNTER — Encounter (HOSPITAL_COMMUNITY)
Admission: RE | Admit: 2022-04-23 | Discharge: 2022-04-23 | Disposition: A | Discharge status: Home / Self Care | Payer: Medicare PPO | Source: Ambulatory Visit | Attending: General Surgery | Admitting: General Surgery

## 2022-04-23 ENCOUNTER — Other Ambulatory Visit: Payer: Self-pay

## 2022-04-23 ENCOUNTER — Encounter (HOSPITAL_COMMUNITY): Payer: Self-pay

## 2022-04-23 VITALS — BP 135/68 | HR 70 | Temp 98.4°F | Resp 18 | Ht 65.0 in | Wt 196.0 lb

## 2022-04-23 DIAGNOSIS — Z01818 Encounter for other preprocedural examination: Secondary | ICD-10-CM | POA: Diagnosis not present

## 2022-04-23 HISTORY — DX: Anemia, unspecified: D64.9

## 2022-04-23 LAB — CBC
HCT: 39 % (ref 36.0–46.0)
Hemoglobin: 13.2 g/dL (ref 12.0–15.0)
MCH: 29.9 pg (ref 26.0–34.0)
MCHC: 33.8 g/dL (ref 30.0–36.0)
MCV: 88.2 fL (ref 80.0–100.0)
Platelets: 253 10*3/uL (ref 150–400)
RBC: 4.42 MIL/uL (ref 3.87–5.11)
RDW: 13 % (ref 11.5–15.5)
WBC: 7.7 10*3/uL (ref 4.0–10.5)
nRBC: 0 % (ref 0.0–0.2)

## 2022-04-23 LAB — BASIC METABOLIC PANEL
Anion gap: 8 (ref 5–15)
BUN: 14 mg/dL (ref 8–23)
CO2: 27 mmol/L (ref 22–32)
Calcium: 9.3 mg/dL (ref 8.9–10.3)
Chloride: 103 mmol/L (ref 98–111)
Creatinine, Ser: 0.69 mg/dL (ref 0.44–1.00)
GFR, Estimated: >60 mL/min (ref >60)
Glucose, Bld: 103 mg/dL — ABNORMAL HIGH (ref 70–99)
Potassium: 3.9 mmol/L (ref 3.5–5.1)
Sodium: 138 mmol/L (ref 135–145)

## 2022-04-23 NOTE — Progress Notes (Addendum)
PCP - Sigmund Hazel, MD Cardiologist - denies Oncologist - Malachy Mood, MD  PPM/ICD - denies Device Orders - n/a Rep Notified - n/a  Chest x-ray - n/a EKG - 04/23/22 Stress Test - denies ECHO - denies Cardiac Cath - denies  Sleep Study - denies CPAP - n/a  Fasting Blood Sugar - n/a  Blood Thinner Instructions: n/a Aspirin Instructions - patient verbalized that she is taking Aspirin PRN for pain and she didn't need it Patient was instructed: As of today, STOP taking any Aspirin (unless otherwise instructed by your surgeon) Aleve, Naproxen, Ibuprofen, Motrin, Advil, Goody's, BC's, all herbal medications, fish oil, and all vitamins.   ERAS Protcol - yes, until 05:30 o'clock   COVID TEST- n/a   Anesthesia review: no  Patient denies shortness of breath, fever, cough and chest pain at PAT appointment   All instructions explained to the patient, with a verbal understanding of the material. Patient agrees to go over the instructions while at home for a better understanding. Patient also instructed to self quarantine after being tested for COVID-19. The opportunity to ask questions was provided.

## 2022-04-29 ENCOUNTER — Ambulatory Visit
Admission: RE | Admit: 2022-04-29 | Discharge: 2022-04-29 | Disposition: A | Discharge status: Home / Self Care | Payer: Medicare PPO | Source: Ambulatory Visit | Attending: General Surgery | Admitting: General Surgery

## 2022-04-29 DIAGNOSIS — Z170 Estrogen receptor positive status [ER+]: Secondary | ICD-10-CM

## 2022-04-29 DIAGNOSIS — C50911 Malignant neoplasm of unspecified site of right female breast: Secondary | ICD-10-CM | POA: Diagnosis not present

## 2022-04-29 HISTORY — PX: BREAST BIOPSY: SHX20

## 2022-04-30 ENCOUNTER — Ambulatory Visit (HOSPITAL_COMMUNITY)
Admission: RE | Admit: 2022-04-30 | Discharge: 2022-04-30 | Disposition: A | Discharge status: Home / Self Care | Payer: Medicare PPO | Source: Ambulatory Visit | Attending: General Surgery | Admitting: General Surgery

## 2022-04-30 ENCOUNTER — Other Ambulatory Visit: Payer: Self-pay

## 2022-04-30 ENCOUNTER — Ambulatory Visit (HOSPITAL_COMMUNITY): Payer: Medicare PPO | Admitting: Certified Registered Nurse Anesthetist

## 2022-04-30 ENCOUNTER — Ambulatory Visit (HOSPITAL_BASED_OUTPATIENT_CLINIC_OR_DEPARTMENT_OTHER): Payer: Medicare PPO | Admitting: Certified Registered Nurse Anesthetist

## 2022-04-30 ENCOUNTER — Encounter (HOSPITAL_COMMUNITY)
Admission: RE | Disposition: A | Discharge status: Home / Self Care | Payer: Self-pay | Source: Ambulatory Visit | Attending: General Surgery

## 2022-04-30 ENCOUNTER — Ambulatory Visit
Admission: RE | Admit: 2022-04-30 | Discharge: 2022-04-30 | Disposition: A | Discharge status: Home / Self Care | Payer: Medicare PPO | Source: Ambulatory Visit | Attending: General Surgery | Admitting: General Surgery

## 2022-04-30 ENCOUNTER — Encounter (HOSPITAL_COMMUNITY): Payer: Self-pay | Admitting: General Surgery

## 2022-04-30 DIAGNOSIS — C50911 Malignant neoplasm of unspecified site of right female breast: Secondary | ICD-10-CM

## 2022-04-30 DIAGNOSIS — R928 Other abnormal and inconclusive findings on diagnostic imaging of breast: Secondary | ICD-10-CM | POA: Diagnosis not present

## 2022-04-30 DIAGNOSIS — Z803 Family history of malignant neoplasm of breast: Secondary | ICD-10-CM | POA: Insufficient documentation

## 2022-04-30 DIAGNOSIS — Z170 Estrogen receptor positive status [ER+]: Secondary | ICD-10-CM | POA: Insufficient documentation

## 2022-04-30 DIAGNOSIS — N6011 Diffuse cystic mastopathy of right breast: Secondary | ICD-10-CM | POA: Diagnosis not present

## 2022-04-30 DIAGNOSIS — Z87891 Personal history of nicotine dependence: Secondary | ICD-10-CM | POA: Diagnosis not present

## 2022-04-30 DIAGNOSIS — Z01818 Encounter for other preprocedural examination: Secondary | ICD-10-CM

## 2022-04-30 DIAGNOSIS — Z800 Family history of malignant neoplasm of digestive organs: Secondary | ICD-10-CM | POA: Diagnosis not present

## 2022-04-30 DIAGNOSIS — Z801 Family history of malignant neoplasm of trachea, bronchus and lung: Secondary | ICD-10-CM | POA: Insufficient documentation

## 2022-04-30 DIAGNOSIS — I10 Essential (primary) hypertension: Secondary | ICD-10-CM | POA: Diagnosis not present

## 2022-04-30 DIAGNOSIS — N6489 Other specified disorders of breast: Secondary | ICD-10-CM | POA: Diagnosis not present

## 2022-04-30 DIAGNOSIS — C50511 Malignant neoplasm of lower-outer quadrant of right female breast: Secondary | ICD-10-CM | POA: Diagnosis not present

## 2022-04-30 DIAGNOSIS — Z807 Family history of other malignant neoplasms of lymphoid, hematopoietic and related tissues: Secondary | ICD-10-CM | POA: Diagnosis not present

## 2022-04-30 DIAGNOSIS — N6021 Fibroadenosis of right breast: Secondary | ICD-10-CM | POA: Diagnosis not present

## 2022-04-30 DIAGNOSIS — N6081 Other benign mammary dysplasias of right breast: Secondary | ICD-10-CM | POA: Diagnosis not present

## 2022-04-30 HISTORY — PX: BREAST LUMPECTOMY WITH RADIOACTIVE SEED LOCALIZATION: SHX6424

## 2022-04-30 SURGERY — BREAST LUMPECTOMY WITH RADIOACTIVE SEED LOCALIZATION
Anesthesia: General | Site: Breast | Laterality: Right

## 2022-04-30 MED ORDER — PROPOFOL 10 MG/ML IV BOLUS
INTRAVENOUS | Status: AC
  Filled 2022-04-30: qty 20

## 2022-04-30 MED ORDER — ONDANSETRON HCL 4 MG/2ML IJ SOLN
INTRAMUSCULAR | Status: AC
  Filled 2022-04-30: qty 2

## 2022-04-30 MED ORDER — GABAPENTIN 100 MG PO CAPS
100.0000 mg | ORAL_CAPSULE | ORAL | Status: AC
  Administered 2022-04-30: 100 mg via ORAL
  Filled 2022-04-30: qty 1

## 2022-04-30 MED ORDER — BUPIVACAINE HCL (PF) 0.25 % IJ SOLN
INTRAMUSCULAR | Status: AC
  Filled 2022-04-30: qty 30

## 2022-04-30 MED ORDER — LACTATED RINGERS IV SOLN: INTRAVENOUS | Status: DC

## 2022-04-30 MED ORDER — PROPOFOL 10 MG/ML IV BOLUS
INTRAVENOUS | Status: DC | PRN
  Administered 2022-04-30: 200 mg via INTRAVENOUS

## 2022-04-30 MED ORDER — LIDOCAINE 2% (20 MG/ML) 5 ML SYRINGE
INTRAMUSCULAR | Status: AC
  Filled 2022-04-30: qty 5

## 2022-04-30 MED ORDER — ONDANSETRON HCL 4 MG/2ML IJ SOLN
INTRAMUSCULAR | Status: DC | PRN
  Administered 2022-04-30: 4 mg via INTRAVENOUS

## 2022-04-30 MED ORDER — OXYCODONE HCL 5 MG/5ML PO SOLN: 5.0000 mg | Freq: Once | ORAL | Status: DC | PRN

## 2022-04-30 MED ORDER — CHLORHEXIDINE GLUCONATE CLOTH 2 % EX PADS: 6.0000 | MEDICATED_PAD | Freq: Once | CUTANEOUS | Status: DC

## 2022-04-30 MED ORDER — FENTANYL CITRATE (PF) 250 MCG/5ML IJ SOLN
INTRAMUSCULAR | Status: DC | PRN
  Administered 2022-04-30 (×2): 25 ug via INTRAVENOUS
  Administered 2022-04-30: 50 ug via INTRAVENOUS

## 2022-04-30 MED ORDER — BUPIVACAINE-EPINEPHRINE 0.25% -1:200000 IJ SOLN
INTRAMUSCULAR | Status: DC | PRN
  Administered 2022-04-30: 20 mL

## 2022-04-30 MED ORDER — PHENYLEPHRINE 80 MCG/ML (10ML) SYRINGE FOR IV PUSH (FOR BLOOD PRESSURE SUPPORT)
PREFILLED_SYRINGE | INTRAVENOUS | Status: AC
  Filled 2022-04-30: qty 10

## 2022-04-30 MED ORDER — FENTANYL CITRATE (PF) 100 MCG/2ML IJ SOLN: 25.0000 ug | INTRAMUSCULAR | Status: DC | PRN

## 2022-04-30 MED ORDER — ORAL CARE MOUTH RINSE: 15.0000 mL | Freq: Once | OROMUCOSAL | Status: AC

## 2022-04-30 MED ORDER — OXYCODONE HCL 5 MG PO TABS: 5.0000 mg | ORAL_TABLET | Freq: Once | ORAL | Status: DC | PRN

## 2022-04-30 MED ORDER — KETOROLAC TROMETHAMINE 30 MG/ML IJ SOLN: 30.0000 mg | Freq: Once | INTRAMUSCULAR | Status: DC | PRN

## 2022-04-30 MED ORDER — 0.9 % SODIUM CHLORIDE (POUR BTL) OPTIME
TOPICAL | Status: DC | PRN
  Administered 2022-04-30: 1000 mL

## 2022-04-30 MED ORDER — ONDANSETRON HCL 4 MG/2ML IJ SOLN: 4.0000 mg | Freq: Once | INTRAMUSCULAR | Status: DC | PRN

## 2022-04-30 MED ORDER — LIDOCAINE 2% (20 MG/ML) 5 ML SYRINGE
INTRAMUSCULAR | Status: DC | PRN
  Administered 2022-04-30: 100 mg via INTRAVENOUS

## 2022-04-30 MED ORDER — EPHEDRINE 5 MG/ML INJ
INTRAVENOUS | Status: AC
  Filled 2022-04-30: qty 5

## 2022-04-30 MED ORDER — CHLORHEXIDINE GLUCONATE 0.12 % MT SOLN
15.0000 mL | Freq: Once | OROMUCOSAL | Status: AC
  Administered 2022-04-30: 15 mL via OROMUCOSAL
  Filled 2022-04-30: qty 15

## 2022-04-30 MED ORDER — TRAMADOL HCL 50 MG PO TABS
50.0000 mg | ORAL_TABLET | Freq: Four times a day (QID) | ORAL | 0 refills | Status: DC | PRN
Start: 2022-04-30 — End: 2022-09-17

## 2022-04-30 MED ORDER — ACETAMINOPHEN 500 MG PO TABS
1000.0000 mg | ORAL_TABLET | ORAL | Status: AC
  Administered 2022-04-30: 1000 mg via ORAL
  Filled 2022-04-30: qty 2

## 2022-04-30 MED ORDER — CEFAZOLIN SODIUM-DEXTROSE 2-4 GM/100ML-% IV SOLN
2.0000 g | INTRAVENOUS | Status: AC
  Administered 2022-04-30: 2 g via INTRAVENOUS
  Filled 2022-04-30: qty 100

## 2022-04-30 MED ORDER — EPHEDRINE SULFATE-NACL 50-0.9 MG/10ML-% IV SOSY
PREFILLED_SYRINGE | INTRAVENOUS | Status: DC | PRN
  Administered 2022-04-30: 10 mg via INTRAVENOUS
  Administered 2022-04-30: 5 mg via INTRAVENOUS
  Administered 2022-04-30 (×2): 10 mg via INTRAVENOUS

## 2022-04-30 MED ORDER — FENTANYL CITRATE (PF) 250 MCG/5ML IJ SOLN
INTRAMUSCULAR | Status: AC
  Filled 2022-04-30: qty 5

## 2022-04-30 SURGICAL SUPPLY — 31 items
ADH SKN CLS APL DERMABOND .7 (GAUZE/BANDAGES/DRESSINGS) ×1
APL PRP STRL LF DISP 70% ISPRP (MISCELLANEOUS) ×1
APPLIER CLIP 9.375 MED OPEN (MISCELLANEOUS) ×1
APR CLP MED 9.3 20 MLT OPN (MISCELLANEOUS) ×1
BAG COUNTER SPONGE SURGICOUNT (BAG) IMPLANT
BAG SPNG CNTER NS LX DISP (BAG) ×1
CANISTER SUCT 3000ML PPV (MISCELLANEOUS) ×1 IMPLANT
CHLORAPREP W/TINT 26 (MISCELLANEOUS) ×1 IMPLANT
CLIP APPLIE 9.375 MED OPEN (MISCELLANEOUS) IMPLANT
COVER PROBE W GEL 5X96 (DRAPES) ×1 IMPLANT
COVER SURGICAL LIGHT HANDLE (MISCELLANEOUS) ×1 IMPLANT
DERMABOND ADVANCED .7 DNX12 (GAUZE/BANDAGES/DRESSINGS) ×1 IMPLANT
DEVICE DUBIN SPECIMEN MAMMOGRA (MISCELLANEOUS) ×1 IMPLANT
DRAPE CHEST BREAST 15X10 FENES (DRAPES) ×1 IMPLANT
ELECT COATED BLADE 2.86 ST (ELECTRODE) ×1 IMPLANT
ELECT REM PT RETURN 9FT ADLT (ELECTROSURGICAL) ×1
ELECTRODE REM PT RTRN 9FT ADLT (ELECTROSURGICAL) ×1 IMPLANT
GLOVE BIO SURGEON STRL SZ7.5 (GLOVE) ×2 IMPLANT
GOWN STRL REUS W/ TWL LRG LVL3 (GOWN DISPOSABLE) ×2 IMPLANT
GOWN STRL REUS W/TWL LRG LVL3 (GOWN DISPOSABLE) ×2
KIT BASIN OR (CUSTOM PROCEDURE TRAY) ×1 IMPLANT
KIT MARKER MARGIN INK (KITS) ×1 IMPLANT
NDL HYPO 25GX1X1/2 BEV (NEEDLE) ×1 IMPLANT
NEEDLE HYPO 25GX1X1/2 BEV (NEEDLE) ×1 IMPLANT
NS IRRIG 1000ML POUR BTL (IV SOLUTION) ×1 IMPLANT
PACK GENERAL/GYN (CUSTOM PROCEDURE TRAY) ×1 IMPLANT
SUT MNCRL AB 4-0 PS2 18 (SUTURE) ×1 IMPLANT
SUT VIC AB 3-0 SH 18 (SUTURE) ×1 IMPLANT
SYR CONTROL 10ML LL (SYRINGE) ×1 IMPLANT
TOWEL GREEN STERILE (TOWEL DISPOSABLE) ×1 IMPLANT
TOWEL GREEN STERILE FF (TOWEL DISPOSABLE) ×1 IMPLANT

## 2022-04-30 NOTE — Anesthesia Procedure Notes (Signed)
Procedure Name: LMA Insertion Date/Time: 04/30/2022 8:22 AM  Performed by: Cy Blamer, CRNAPre-anesthesia Checklist: Patient identified, Emergency Drugs available, Suction available, Patient being monitored and Timeout performed Patient Re-evaluated:Patient Re-evaluated prior to induction Oxygen Delivery Method: Circle system utilized Preoxygenation: Pre-oxygenation with 100% oxygen Induction Type: IV induction LMA: LMA inserted LMA Size: 4.0 Number of attempts: 1 Tube secured with: Tape Dental Injury: Teeth and Oropharynx as per pre-operative assessment

## 2022-04-30 NOTE — Interval H&P Note (Signed)
History and Physical Interval Note:  04/30/2022 7:28 AM  Kaitlyn Reed  has presented today for surgery, with the diagnosis of RIGHT BREAST CANCER.  The various methods of treatment have been discussed with the patient and family. After consideration of risks, benefits and other options for treatment, the patient has consented to  Procedure(s): RIGHT BREAST LUMPECTOMY WITH RADIOACTIVE SEED LOCALIZATION (Right) as a surgical intervention.  The patient's history has been reviewed, patient examined, no change in status, stable for surgery.  I have reviewed the patient's chart and labs.  Questions were answered to the patient's satisfaction.     Chevis Pretty III

## 2022-04-30 NOTE — H&P (Signed)
REFERRING PHYSICIAN: Malachy Mood, MD PROVIDER: Lindell Noe, MD MRN: [id number] DOB: [birth year] Subjective   Chief Complaint: Breast Cancer  History of Present Illness: Kaitlyn Reed is a 72 y.o. female who is seen today as an office consultation for evaluation of Breast Cancer  We are asked to see the patient in consultation by Dr. Mosetta Putt to evaluate her for a new right breast cancer. The patient is a 72 year old white female who recently went for a routine screening mammogram. At that time she was found to have a 9 mm area of asymmetry in the lower outer quadrant of the right breast. The axillary lymph nodes looked normal. 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 She is otherwise in good health except for some hypertension. She does not smoke. She has a family history of multiple myeloma and her brother, breast cancer in a cousin, and colon cancer in her grandmother and lung cancer in her grandfather all on her paternal side.  Review of Systems: A complete review of systems was obtained from the patient. I have reviewed this information and discussed as appropriate with the patient. See HPI as well for other ROS.  ROS   Medical History: Past Medical History:  Diagnosis Date  Anxiety  History of cancer  Hypertension   Patient Active Problem List  Diagnosis  Malignant neoplasm of lower-outer quadrant of right breast of female, estrogen receptor positive  Hypertensive disorder  Arthritis   Past Surgical History:  Procedure Laterality Date  CESAREAN SECTION N/A  June 1981 and May 1985  CHOLECYSTECTOMY N/A  1998  HYSTERECTOMY N/A  1990 Partial Hysterectomy    Allergies  Allergen Reactions  Codeine Nausea  Nausea   Current Outpatient Medications on File Prior to Visit  Medication Sig Dispense Refill  hydroCHLOROthiazide (HYDRODIURIL) 25 MG tablet Take 0.5 tablets by  mouth once daily  metoprolol succinate (TOPROL-XL) 25 MG XL tablet Take 1 tablet by mouth once daily   No current facility-administered medications on file prior to visit.   Family History  Problem Relation Age of Onset  Coronary Artery Disease (Blocked arteries around heart) Father  Skin cancer Brother  Coronary Artery Disease (Blocked arteries around heart) Brother  Multiple myeloma Brother    Social History   Tobacco Use  Smoking Status Former  Types: Cigarettes  Smokeless Tobacco Never  Tobacco Comments  Quit 40 plus years ago per pt    Social History   Socioeconomic History  Marital status: Married  Tobacco Use  Smoking status: Former  Types: Cigarettes  Smokeless tobacco: Never  Tobacco comments:  Quit 40 plus years ago per pt  Vaping Use  Vaping Use: Never used  Substance and Sexual Activity  Alcohol use: Yes  Alcohol/week: 3.0 standard drinks of alcohol  Types: 3 Glasses of wine per week  Comment: 3 glasses of wine a week  Drug use: Never   Objective:  There were no vitals filed for this visit.  There is no height or weight on file to calculate BMI.  Physical Exam Vitals reviewed.  Constitutional:  General: She is not in acute distress. Appearance: Normal appearance.  HENT:  Head: Normocephalic and atraumatic.  Right Ear: External ear normal.  Left Ear: External ear normal.  Nose: Nose normal.  Mouth/Throat:  Mouth: Mucous membranes are moist.  Pharynx: Oropharynx is clear.  Eyes:  General: No scleral icterus. Extraocular  Movements: Extraocular movements intact.  Conjunctiva/sclera: Conjunctivae normal.  Pupils: Pupils are equal, round, and reactive to light.  Cardiovascular:  Rate and Rhythm: Normal rate and regular rhythm.  Pulses: Normal pulses.  Heart sounds: Normal heart sounds.  Pulmonary:  Effort: Pulmonary effort is normal. No respiratory distress.  Breath sounds: Normal breath sounds.  Abdominal:  General: Bowel sounds are  normal.  Palpations: Abdomen is soft.  Tenderness: There is no abdominal tenderness.  Musculoskeletal:  General: No swelling, tenderness or deformity. Normal range of motion.  Cervical back: Normal range of motion and neck supple.  Skin: General: Skin is warm and dry.  Coloration: Skin is not jaundiced.  Neurological:  General: No focal deficit present.  Mental Status: She is alert and oriented to person, place, and time.  Psychiatric:  Mood and Affect: Mood normal.  Behavior: Behavior normal.     Breast: There is no palpable mass in either breast. There is no palpable axillary, supraclavicular, or cervical lymphadenopathy.  Labs, Imaging and Diagnostic Testing:  Assessment and Plan:   Diagnoses and all orders for this visit:  Malignant neoplasm of lower-outer quadrant of right breast of female, estrogen receptor positive    The patient appears to have a 9 mm cancer in the lower outer quadrant of the right breast with clinically negative nodes and all favorable markers. I have discussed with her in detail the different options for treatment and at this point she favors breast conservation which I feel is very reasonable. Given the small cancer and favorable nature she likely does not need a node evaluation. I have discussed with her in detail the risks and benefits of the operation as well as some of the technical aspects including the use of a radioactive seed for localization and she understands and wishes to proceed. She will also meet with medical and radiation oncology to discuss adjuvant therapy. We will begin surgical planning.

## 2022-04-30 NOTE — Anesthesia Postprocedure Evaluation (Signed)
Anesthesia Post Note  Patient: Kaitlyn Reed  Procedure(s) Performed: RIGHT BREAST LUMPECTOMY WITH RADIOACTIVE SEED LOCALIZATION (Right: Breast)     Patient location during evaluation: PACU Anesthesia Type: General Level of consciousness: awake and alert Pain management: pain level controlled Vital Signs Assessment: post-procedure vital signs reviewed and stable Respiratory status: spontaneous breathing, nonlabored ventilation, respiratory function stable and patient connected to nasal cannula oxygen Cardiovascular status: blood pressure returned to baseline and stable Postop Assessment: no apparent nausea or vomiting Anesthetic complications: no  No notable events documented.  Last Vitals:  Vitals:   04/30/22 0915 04/30/22 0930  BP: 129/63 (!) 126/58  Pulse: 82 69  Resp: 16 15  Temp:  36.6 C  SpO2: 97% 100%    Last Pain:  Vitals:   04/30/22 0930  TempSrc:   PainSc: 0-No pain                 Rabon Scholle S

## 2022-04-30 NOTE — Transfer of Care (Signed)
Immediate Anesthesia Transfer of Care Note  Patient: Kaitlyn Reed  Procedure(s) Performed: RIGHT BREAST LUMPECTOMY WITH RADIOACTIVE SEED LOCALIZATION (Right: Breast)  Patient Location: PACU  Anesthesia Type:General  Level of Consciousness: awake, alert , and oriented  Airway & Oxygen Therapy: Patient Spontanous Breathing  Post-op Assessment: Report given to RN, Post -op Vital signs reviewed and stable, Patient moving all extremities X 4, and Patient able to stick tongue midline  Post vital signs: Reviewed  Last Vitals:  Vitals Value Taken Time  BP 135/57   Temp 97.8   Pulse 86 04/30/22 0908  Resp 19 04/30/22 0908  SpO2 98 % 04/30/22 0908    Last Pain:  Vitals:   04/30/22 0727  TempSrc:   PainSc: 0-No pain         Complications: No notable events documented.

## 2022-04-30 NOTE — Anesthesia Preprocedure Evaluation (Signed)
Anesthesia Evaluation  Patient identified by MRN, date of birth, ID band Patient awake    Reviewed: Allergy & Precautions, H&P , NPO status , Patient's Chart, lab work & pertinent test results  Airway Mallampati: II  TM Distance: >3 FB Neck ROM: Full    Dental no notable dental hx.    Pulmonary neg pulmonary ROS, former smoker   Pulmonary exam normal breath sounds clear to auscultation       Cardiovascular hypertension, Normal cardiovascular exam Rhythm:Regular Rate:Normal     Neuro/Psych negative neurological ROS  negative psych ROS   GI/Hepatic negative GI ROS, Neg liver ROS,,,  Endo/Other  negative endocrine ROS    Renal/GU negative Renal ROS  negative genitourinary   Musculoskeletal negative musculoskeletal ROS (+)    Abdominal   Peds negative pediatric ROS (+)  Hematology negative hematology ROS (+)   Anesthesia Other Findings   Reproductive/Obstetrics negative OB ROS                             Anesthesia Physical Anesthesia Plan  ASA: 2  Anesthesia Plan: General   Post-op Pain Management: Tylenol PO (pre-op)*   Induction: Intravenous  PONV Risk Score and Plan: 3 and Ondansetron, Dexamethasone and Treatment may vary due to age or medical condition  Airway Management Planned: LMA  Additional Equipment:   Intra-op Plan:   Post-operative Plan: Extubation in OR  Informed Consent: I have reviewed the patients History and Physical, chart, labs and discussed the procedure including the risks, benefits and alternatives for the proposed anesthesia with the patient or authorized representative who has indicated his/her understanding and acceptance.     Dental advisory given  Plan Discussed with: CRNA and Surgeon  Anesthesia Plan Comments:        Anesthesia Quick Evaluation

## 2022-04-30 NOTE — Op Note (Signed)
04/30/2022  8:58 AM  PATIENT:  Kaitlyn Reed  73 y.o. female  PRE-OPERATIVE DIAGNOSIS:  RIGHT BREAST CANCER  POST-OPERATIVE DIAGNOSIS:  RIGHT BREAST CANCER  PROCEDURE:  Procedure(s): RIGHT BREAST LUMPECTOMY WITH RADIOACTIVE SEED LOCALIZATION (Right)  SURGEON:  Surgeon(s) and Role:    * Griselda Miner, MD - Primary  PHYSICIAN ASSISTANT:   ASSISTANTS: none   ANESTHESIA:   local and general  EBL:  1 mL   BLOOD ADMINISTERED:none  DRAINS: none   LOCAL MEDICATIONS USED:  MARCAINE     SPECIMEN:  Source of Specimen:  right breast tissue  DISPOSITION OF SPECIMEN:  PATHOLOGY  COUNTS:  YES  TOURNIQUET:  * No tourniquets in log *  DICTATION: .Dragon Dictation  After informed consent was obtained the patient was brought to the operating room and placed in the supine position on the operating table.  After adequate induction of general anesthesia the patient's right breast was prepped with ChloraPrep, allowed to dry, and draped in usual sterile manner.  An appropriate timeout was performed.  Previously an I-125 seed was placed in the lower outer right breast to mark an area of invasive breast cancer.  The neoprobe was set to I-125 in the area of radioactivity was readily identified.  The area around this was infiltrated with quarter percent Marcaine.  A curvilinear incision was made along the lower outer edge of the areola of the right breast with a 15 blade knife.  The incision was carried through the skin and subcutaneous tissue sharply with the electrocautery.  Dissection was then carried out in the lower outer quadrant between the breast tissue in the subcutaneous fat and skin.  Once this dissection was beyond the area of the cancer I then removed a circular portion of breast tissue sharply with the electrocautery around the radioactive seed while checking the area of radioactivity frequently.  Once the specimen was removed it was oriented with the appropriate paint colors.  A  specimen radiograph was obtained that showed the clip and seed to be near the center of the specimen.  The specimen was then sent to pathology for further evaluation.  Hemostasis was achieved using the Bovie electrocautery.  The wound was irrigated with saline and infiltrated with more quarter percent Marcaine.  The deep layer of the wound was then closed with layers of interrupted 3-0 Vicryl stitches.  The skin was then closed with interrupted 4-0 Monocryl subcuticular stitches.  Dermabond dressings were applied.  The patient tolerated the procedure well.  At the end of the case all needle sponge and instrument counts were correct.  The patient was then awakened and taken to recovery in stable condition.  PLAN OF CARE: Discharge to home after PACU  PATIENT DISPOSITION:  PACU - hemodynamically stable.   Delay start of Pharmacological VTE agent (>24hrs) due to surgical blood loss or risk of bleeding: not applicable

## 2022-05-01 ENCOUNTER — Encounter (HOSPITAL_COMMUNITY): Payer: Self-pay | Admitting: General Surgery

## 2022-05-01 DIAGNOSIS — C50911 Malignant neoplasm of unspecified site of right female breast: Secondary | ICD-10-CM | POA: Diagnosis not present

## 2022-05-07 LAB — SURGICAL PATHOLOGY

## 2022-05-08 ENCOUNTER — Encounter: Payer: Self-pay | Admitting: *Deleted

## 2022-05-08 ENCOUNTER — Telehealth: Payer: Self-pay | Admitting: *Deleted

## 2022-05-08 NOTE — Telephone Encounter (Signed)
Ordered oncotype per Dr. Feng. Requisition sent to pathology and exact sciences. 

## 2022-05-20 DIAGNOSIS — Z170 Estrogen receptor positive status [ER+]: Secondary | ICD-10-CM | POA: Diagnosis not present

## 2022-05-20 DIAGNOSIS — C50511 Malignant neoplasm of lower-outer quadrant of right female breast: Secondary | ICD-10-CM | POA: Diagnosis not present

## 2022-05-21 ENCOUNTER — Encounter (HOSPITAL_COMMUNITY): Payer: Self-pay

## 2022-05-23 ENCOUNTER — Telehealth: Payer: Self-pay | Admitting: *Deleted

## 2022-05-23 ENCOUNTER — Encounter: Payer: Self-pay | Admitting: *Deleted

## 2022-05-23 DIAGNOSIS — Z170 Estrogen receptor positive status [ER+]: Secondary | ICD-10-CM

## 2022-05-23 NOTE — Telephone Encounter (Signed)
Received oncoytpe results of 13/4%. Referral placed for Dr.Kinard. Spoke with patient to inform her of the results and no chemo needed and next step of xrt.

## 2022-05-26 NOTE — Progress Notes (Incomplete)
Location of Breast Cancer: Malignant neoplasm of lower-outer quadrant of right breast of female, estrogen receptor positive (HCC)   Histology per Pathology Report:  04-30-22 FINAL MICROSCOPIC DIAGNOSIS:  A. 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  Negative for DCIS     - Closest margin, invasive: Inferior and Anterior, 1.0 mm; Superior, 3.0 mm     - Closet margin, DCIS: Superior and Anterior, 2.0 mm; Inferior, 3.0 mm - Lymphovascular invasion (LVI): Not identified - Calcifications: Present - Prognostic markers:  ER positive (95%), PR positive (95%), Her2 negative (0), Ki-67 (1%) - Other: Small intraductal papilloma with usual ductal hyperplasia (UDH), fibrocystic changes including apocrine metaplasia, usual ductal hyperplasia (UDH) with associated microcalcifications, adenosis and biopsy site changes. - See oncology table and comment  INVASIVE CARCINOMA OF THE BREAST:  Resection  Procedure: Lumpectomy Specimen Laterality: Right Histologic Type: Ductal carcinoma Histologic Grade:      Glandular (Acinar)/Tubular Differentiation: 2      Nuclear Pleomorphism: 2      Mitotic Rate: 1      Overall Grade: 1 Tumor Size: 1.4 cm Ductal Carcinoma In Situ: Present Lymphatic and/or Vascular Invasion: Not identified Treatment Effect in the Breast: No known presurgical therapy Margins: All margins negative for invasive carcinoma      Distance from Closest Margin (mm): 1.0 mm      Specify Closest Margin (required only if <72mm): Inferior and Anterior, 1.0 mm; Superior, 3.0 mm DCIS Margins: Uninvolved by DCIS      Distance from Closest Margin (mm): 2.0 mm      Specify Closest Margin (required only if <28mm): Superior and Anterior, 2.0 mm; Inferior, 3.0 mm Regional Lymph Nodes: Not applicable (no lymph nodes submitted or  found)      Distant Metastasis: N/A      Distant Site(s) Involved: N/A Breast Biomarker Testing Performed on Previous Biopsy:      Testing Performed on Case Number: [id number]            Estrogen Receptor: 95%, positive, strong staining intensity            Progesterone Receptor: 95%, positive, strong staining intensity            HER2: Negative (0)            Ki-67: 1% Pathologic Stage Classification (pTNM, AJCC 8th Edition): pT1c, pNx Representative Tumor Block: A2 Comment(s): Immunohistochemical staining for CK5/6 and ER is performed on blocks A2 and A4.  The staining patterns are consistent with the above diagnosis.  Immunohistochemical staining for CK5/6 and ER is performed on block A8 and the results are noncontributory. (v4.5.0.0)   Receptor Status: ER(95%), PR (95%), Her2-neu (neg), Ki-67(1%)  Did patient present with symptoms (if so, please note symptoms) or was this found on screening mammography? Screening mammogram  Past/Anticipated interventions by surgeon, if any: 04-30-22 PRE-OPERATIVE DIAGNOSIS:  RIGHT BREAST CANCER   POST-OPERATIVE DIAGNOSIS:  RIGHT BREAST CANCER   PROCEDURE:  Procedure(s): RIGHT BREAST LUMPECTOMY WITH RADIOACTIVE SEED LOCALIZATION (Right)   SURGEON:  Surgeon(s) and Role:    Griselda Miner, MD - Primary  Past/Anticipated interventions by medical oncology, if any:  03-26-22 Dr. Mosetta Putt Cancer Staging  Malignant neoplasm of lower-outer quadrant of right breast of female, estrogen receptor positive (HCC) Staging form: Breast, AJCC 8th Edition - Clinical stage from 03/18/2022: Stage IA (cT1b,  cN0, cM0, G1, ER+, PR+, HER2-) - Signed by Malachy Mood, MD on 03/26/2022 Stage prefix: Initial diagnosis Histologic grading system: 3 grade system        Malignant neoplasm of lower-outer quadrant of right breast of female, estrogen receptor positive (HCC)   03/18/2022 Cancer Staging     Staging form: Breast, AJCC 8th Edition - Clinical stage from 03/18/2022:  Stage IA (cT1b, cN0, cM0, G1, ER+, PR+, HER2-) - Signed by Malachy Mood, MD on 03/26/2022 Stage prefix: Initial diagnosis Histologic grading system: 3 grade system     03/25/2022 Initial Diagnosis     Malignant neoplasm of lower-outer quadrant of right breast of female, estrogen receptor positive (HCC)     ASSESSMENT & PLAN:  Kaitlyn Reed is a 72 y.o. hysterectomy female with a history of      1.Malignant neoplasm of lower-outer quadrant of right breast of female, estrogen receptor positive (HCC)  --We discussed her imaging findings and the biopsy results in great details. -Given the early stage disease, she likely need a lumpectomy . She is agreeable with that. She was seen by Dr. Carolynne Edouard today and likely will proceed with surgery soon.  -Her tumor is more than 1 cm on her surgical pathology, I recommend a Oncotype Dx test and we'll make a decision about adjuvant chemotherapy based on the Oncotype result. Written material of this test was given to her. She has overall good health  and fit, would be a good candidate for chemotherapy if her Oncotype recurrence score is high.  Given her strong ER and PR expression, low-grade, I suspect that this is likely low risk disease. -Giving the strong ER and PR expression in her postmenopausal status, I recommend adjuvant endocrine therapy with aromatase inhibitor for a total of 5-10 years to reduce the risk of cancer recurrence. Potential benefits and side effects were discussed with patient and she is interested. -She was also seen by radiation oncologist Dr. Roselind Messier today. She will likely benefit from breast radiation if she undergo lumpectomy to decrease the risk of breast cancer.  -We also discussed the breast cancer surveillance after her surgery. She will continue annual screening mammogram, self exam, and a routine office visit with lab and exam with [place]. -I encouraged her to have healthy diet and exercise regularly.    2. Bone Health, osteopenia Her  most recent DEXA was 02/01/2021 showing osteopenia with T score -1.1 in forarm, no high risk for fracture  -We discussed the impact of anticoagulant therapy on her bone density.   PLAN:  -Patient will proceed with right lumpectomy with Dr. Carolynne Edouard soon -If her tumor is more than 1 cm on her surgical pathology, will obtain Oncotype Dx to see if she needs adjuvant radiation -She will likely proceed adjuvant radiation after surgery -I will see her at the end of her radiation, or sooner if needed.  Lymphedema issues, if any:  {:18581} {t:21944}   Pain issues, if any:  {:18581} {PAIN DESCRIPTION:21022940}  SAFETY ISSUES: Prior radiation? {:18581} Pacemaker/ICD? {:18581} Possible current pregnancy?no Is the patient on methotrexate? no  Current Complaints / other details:  ***

## 2022-05-27 ENCOUNTER — Telehealth: Payer: Self-pay

## 2022-05-27 ENCOUNTER — Encounter: Payer: Self-pay | Admitting: Radiation Oncology

## 2022-05-27 NOTE — Progress Notes (Signed)
Radiation Oncology         [phone] ________________________________  Name: Kaitlyn Reed MRN: [id number]  Date: 05/28/2022  DOB: [birth year]  Re-Evaluation Note  CC: Sigmund Hazel, MD  Malachy Mood, MD  No diagnosis found.  Diagnosis: Stage IA (cT1b, cN0, cM0)  Right Breast LOQ, Invasive ductal carcinoma with intermediate grade DCIS, ER+ / PR+ / Her2-, Grade 1: s/p lumpectomy  Narrative:  The patient returns today to discuss radiation treatment options. She was seen in the multidisciplinary breast clinic on 03/26/22.   She underwent genetic testing on her consultation date. Results showed no pathogenic variants identified by [facility] genetic testing.   She opted to proceed with a right breast lumpectomy without nodal biopsies on 04/30/22 under the care of Dr. Carolynne Edouard. Pathology from the procedure revealed: tumor the size of 1.4 cm; histology of grade 1 invasive ductal carcinoma with intermediate grade DCIS; all margins negative for invasive and in situ carcinoma; no lymph nodes were examined. Prognostic indicators significant for: estrogen receptor 95% positive and progesterone receptor 95% positive, both with strong staining intensity; Proliferation marker Ki67 at 1%; Her2 status negative; Grade 1. (Pathology also revealed other benign findings such as a small intraductal papilloma with UDH, and fibrocystic changes including apocrine metaplasia and UDH.   Oncotype DX was obtained on the final surgical sample and the recurrence score of 13 predicts a risk of recurrence outside the breast over the next 9 years of 4%, if the patient's only systemic therapy were to be an antiestrogen for 5 years.  It also predicts no significant benefit from chemotherapy.  The patient will follow-up with Dr. Mosetta Putt in the near future to discuss antiestrogen treatment options.   On review of systems, the patient reports ***. She denies *** and any other symptoms.    Allergies:  is allergic to erythromycin,  no healthtouch food allergies, and codeine.  Meds: Current Outpatient Medications  Medication Sig Dispense Refill   hydrochlorothiazide (HYDRODIURIL) 25 MG tablet Take 25 mg by mouth in the morning.     metoprolol succinate (TOPROL-XL) 25 MG 24 hr tablet Take 25 mg by mouth in the morning.     Multiple Vitamin (MULTIVITAMIN WITH MINERALS) TABS tablet Take 1 tablet by mouth in the morning.     aspirin EC 325 MG tablet Take 325-650 mg by mouth every 8 (eight) hours as needed (pain.).     traMADol (ULTRAM) 50 MG tablet Take 1 tablet (50 mg total) by mouth every 6 (six) hours as needed. 15 tablet 0   No current facility-administered medications for this encounter.    Physical Findings: The patient is in no acute distress. Patient is alert and oriented.  vitals were not taken for this visit.  No significant changes. Lungs are clear to auscultation bilaterally. Heart has regular rate and rhythm. No palpable cervical, supraclavicular, or axillary adenopathy. Abdomen soft, non-tender, normal bowel sounds. Left Breast: no palpable mass, nipple discharge or bleeding. Right Breast: ***  Lab Findings: Lab Results  Component Value Date   WBC 7.7 04/23/2022   HGB 13.2 04/23/2022   HCT 39.0 04/23/2022   MCV 88.2 04/23/2022   PLT 253 04/23/2022    Radiographic Findings: MM Breast Surgical Specimen  Result Date: 04/30/2022 CLINICAL DATA:  Evaluate specimen EXAM: SPECIMEN RADIOGRAPH OF THE RIGHT BREAST COMPARISON:  Previous exam(s). FINDINGS: Status post excision of the right breast. The radioactive seed and biopsy marker clip are present, completely intact, and were marked for pathology. IMPRESSION: Specimen radiograph  of the right breast. Electronically Signed   By: Gerome Sam III M.D.   On: 04/30/2022 08:50  MM RT RADIOACTIVE SEED LOC MAMMO GUIDE  Result Date: 04/29/2022 CLINICAL DATA:  Patient with a RIGHT breast grade 1 invasive ductal carcinoma scheduled for lumpectomy requiring  preoperative radioactive seed localization. EXAM: MAMMOGRAPHIC GUIDED RADIOACTIVE SEED LOCALIZATION OF THE RIGHT BREAST COMPARISON:  Previous exam(s). FINDINGS: Patient presents for radioactive seed localization prior to lumpectomy. I met with the patient and we discussed the procedure of seed localization including benefits and alternatives. We discussed the high likelihood of a successful procedure. We discussed the risks of the procedure including infection, bleeding, tissue injury and further surgery. We discussed the low dose of radioactivity involved in the procedure. Informed, written consent was given. The usual time-out protocol was performed immediately prior to the procedure. Using mammographic guidance, sterile technique, 1% lidocaine and an I-125 radioactive seed, the coil shaped clip within the outer RIGHT breast was localized using a lateral approach. The follow-up mammogram images confirm the seed in the expected location and were marked for Dr. Carolynne Edouard. Follow-up survey of the patient confirms presence of the radioactive seed. Order number of I-125 seed:  [id number]. Total activity:  0.239 millicuries reference Date: 04/02/2022 The patient tolerated the procedure well and was released from the [facility]. She was given instructions regarding seed removal. IMPRESSION: Radioactive seed localization right breast. No apparent complications. Electronically Signed   By: Bary Richard M.D.   On: 04/29/2022 10:25   Impression:  Stage IA (cT1b, cN0, cM0)  Right Breast LOQ, Invasive ductal carcinoma with intermediate grade DCIS, ER+ / PR+ / Her2-, Grade 1: s/p lumpectomy  ***  Plan:  Patient is scheduled for CT simulation {date/later today}. ***  -----------------------------------  Billie Lade, PhD, MD  This document serves as a record of services personally performed by Antony Blackbird, MD. It was created on his behalf by Neena Rhymes, a trained medical scribe. The creation of this record is  based on the scribe's personal observations and the provider's statements to them. This document has been checked and approved by the attending provider.

## 2022-05-27 NOTE — Telephone Encounter (Signed)
Rn called pt to obtain meaningful use and nurse evaluation information. Consult note complete and routed to Dr. Roselind Messier. Pt is doing well overall and eager to get started.

## 2022-05-28 ENCOUNTER — Encounter: Payer: Self-pay | Admitting: General Practice

## 2022-05-28 ENCOUNTER — Ambulatory Visit
Admission: RE | Admit: 2022-05-28 | Discharge: 2022-05-28 | Disposition: A | Discharge status: Home / Self Care | Payer: Medicare PPO | Source: Ambulatory Visit | Attending: Radiation Oncology | Admitting: Radiation Oncology

## 2022-05-28 VITALS — BP 154/76 | HR 84 | Temp 96.4°F | Resp 18 | Ht 65.0 in | Wt 198.0 lb

## 2022-05-28 DIAGNOSIS — Z170 Estrogen receptor positive status [ER+]: Secondary | ICD-10-CM

## 2022-05-28 DIAGNOSIS — C50511 Malignant neoplasm of lower-outer quadrant of right female breast: Secondary | ICD-10-CM | POA: Insufficient documentation

## 2022-05-28 DIAGNOSIS — Z79899 Other long term (current) drug therapy: Secondary | ICD-10-CM | POA: Insufficient documentation

## 2022-05-28 NOTE — Progress Notes (Signed)
[facility] Spiritual Care Note  Received call from Junious Dresser to request an Sports coach for support regarding radiation questions and to inquire about assistance completing a Living Will as part of the [employer].  Answered her questions, providing empathic listening and normalization of feelings.  Placing [facility] Guide referral. Plan to follow up in person at [facility], and Junious Dresser knows to reach out whenever needed/desired.   [address] [place], [place], [facility] Pager [phone] Voicemail [phone]

## 2022-05-29 ENCOUNTER — Other Ambulatory Visit: Payer: Self-pay

## 2022-05-29 ENCOUNTER — Encounter: Payer: Self-pay | Admitting: *Deleted

## 2022-05-29 ENCOUNTER — Ambulatory Visit
Admission: RE | Admit: 2022-05-29 | Discharge: 2022-05-29 | Disposition: A | Discharge status: Home / Self Care | Payer: Medicare PPO | Source: Ambulatory Visit | Attending: Radiation Oncology | Admitting: Radiation Oncology

## 2022-05-29 DIAGNOSIS — C50511 Malignant neoplasm of lower-outer quadrant of right female breast: Secondary | ICD-10-CM | POA: Insufficient documentation

## 2022-05-29 DIAGNOSIS — Z510 Encounter for antineoplastic radiation therapy: Secondary | ICD-10-CM | POA: Diagnosis not present

## 2022-05-29 DIAGNOSIS — Z170 Estrogen receptor positive status [ER+]: Secondary | ICD-10-CM | POA: Diagnosis not present

## 2022-06-06 ENCOUNTER — Inpatient Hospital Stay: Payer: Medicare PPO | Attending: Hematology | Admitting: General Practice

## 2022-06-06 NOTE — Progress Notes (Signed)
[facility] Spiritual Care Note  Assisted Kaitlyn Reed with completing her Living Will. (She has already designated a health care power of attorney and plans to bring a copy of that document to a future [place] appointment for scanning into her electronic medical record.)  Original and copies given to [facility] for her heath care agents and provider outside of [facility]. Copy to Health Information Management to be scanned into her electronic medical record.   [address] [place], [place], [facility] Pager [phone] Voicemail [phone]

## 2022-06-09 DIAGNOSIS — C50911 Malignant neoplasm of unspecified site of right female breast: Secondary | ICD-10-CM | POA: Diagnosis not present

## 2022-06-10 ENCOUNTER — Ambulatory Visit
Admission: RE | Admit: 2022-06-10 | Discharge: 2022-06-10 | Disposition: A | Discharge status: Home / Self Care | Payer: Medicare PPO | Source: Ambulatory Visit | Attending: Radiation Oncology | Admitting: Radiation Oncology

## 2022-06-10 ENCOUNTER — Other Ambulatory Visit: Payer: Self-pay

## 2022-06-10 DIAGNOSIS — C50511 Malignant neoplasm of lower-outer quadrant of right female breast: Secondary | ICD-10-CM

## 2022-06-10 DIAGNOSIS — Z510 Encounter for antineoplastic radiation therapy: Secondary | ICD-10-CM | POA: Diagnosis not present

## 2022-06-10 DIAGNOSIS — Z170 Estrogen receptor positive status [ER+]: Secondary | ICD-10-CM | POA: Diagnosis not present

## 2022-06-10 LAB — RAD ONC ARIA SESSION SUMMARY
Course Elapsed Days: 0
Plan Fractions Treated to Date: 1
Plan Prescribed Dose Per Fraction: 2.67 Gy
Plan Total Fractions Prescribed: 15
Plan Total Prescribed Dose: 40.05 Gy
Reference Point Dosage Given to Date: 2.67 Gy
Reference Point Session Dosage Given: 2.67 Gy
Session Number: 1

## 2022-06-10 MED ORDER — RADIAPLEXRX EX GEL: Freq: Once | CUTANEOUS | Status: DC

## 2022-06-10 MED ORDER — ALRA NON-METALLIC DEODORANT (RAD-ONC): 1.0000 | Freq: Once | TOPICAL | Status: DC

## 2022-06-10 NOTE — Progress Notes (Signed)
Pt here for patient teaching. Pt given Radiation and You booklet, skin care instructions, Alra deodorant, and Radiaplex gel. Reviewed areas of pertinence such as fatigue, hair loss, skin changes, breast tenderness, breast swelling, cough, shortness of breath, earaches, and taste changes. Pt able to give teach back of to pat skin, use unscented/gentle soap, and drink plenty of water, apply Radiaplex bid, avoid applying anything to skin within 4 hours of treatment, avoid wearing an under wire bra, and to use an electric razor if they must shave. Pt verbalizes understanding of information given and will contact nursing with any questions or concerns.     [email or web address]  Education completed by Monica Smith, RN      

## 2022-06-11 ENCOUNTER — Ambulatory Visit
Admission: RE | Admit: 2022-06-11 | Discharge: 2022-06-11 | Disposition: A | Discharge status: Home / Self Care | Payer: Medicare PPO | Source: Ambulatory Visit | Attending: Radiation Oncology | Admitting: Radiation Oncology

## 2022-06-11 ENCOUNTER — Other Ambulatory Visit: Payer: Self-pay

## 2022-06-11 DIAGNOSIS — C50511 Malignant neoplasm of lower-outer quadrant of right female breast: Secondary | ICD-10-CM | POA: Diagnosis not present

## 2022-06-11 DIAGNOSIS — Z170 Estrogen receptor positive status [ER+]: Secondary | ICD-10-CM | POA: Diagnosis not present

## 2022-06-11 DIAGNOSIS — Z510 Encounter for antineoplastic radiation therapy: Secondary | ICD-10-CM | POA: Diagnosis not present

## 2022-06-11 LAB — RAD ONC ARIA SESSION SUMMARY
Course Elapsed Days: 1
Plan Fractions Treated to Date: 2
Plan Prescribed Dose Per Fraction: 2.67 Gy
Plan Total Fractions Prescribed: 15
Plan Total Prescribed Dose: 40.05 Gy
Reference Point Dosage Given to Date: 5.34 Gy
Reference Point Session Dosage Given: 2.67 Gy
Session Number: 2

## 2022-06-12 ENCOUNTER — Other Ambulatory Visit: Payer: Self-pay

## 2022-06-12 ENCOUNTER — Ambulatory Visit
Admission: RE | Admit: 2022-06-12 | Discharge: 2022-06-12 | Disposition: A | Discharge status: Home / Self Care | Payer: Medicare PPO | Source: Ambulatory Visit | Attending: Radiation Oncology | Admitting: Radiation Oncology

## 2022-06-12 DIAGNOSIS — Z170 Estrogen receptor positive status [ER+]: Secondary | ICD-10-CM | POA: Diagnosis not present

## 2022-06-12 DIAGNOSIS — Z510 Encounter for antineoplastic radiation therapy: Secondary | ICD-10-CM | POA: Diagnosis not present

## 2022-06-12 DIAGNOSIS — C50511 Malignant neoplasm of lower-outer quadrant of right female breast: Secondary | ICD-10-CM | POA: Diagnosis not present

## 2022-06-12 LAB — RAD ONC ARIA SESSION SUMMARY
Course Elapsed Days: 2
Plan Fractions Treated to Date: 3
Plan Prescribed Dose Per Fraction: 2.67 Gy
Plan Total Fractions Prescribed: 15
Plan Total Prescribed Dose: 40.05 Gy
Reference Point Dosage Given to Date: 8.01 Gy
Reference Point Session Dosage Given: 2.67 Gy
Session Number: 3

## 2022-06-13 ENCOUNTER — Other Ambulatory Visit: Payer: Self-pay

## 2022-06-13 ENCOUNTER — Ambulatory Visit
Admission: RE | Admit: 2022-06-13 | Discharge: 2022-06-13 | Disposition: A | Discharge status: Home / Self Care | Payer: Medicare PPO | Source: Ambulatory Visit | Attending: Radiation Oncology | Admitting: Radiation Oncology

## 2022-06-13 DIAGNOSIS — Z510 Encounter for antineoplastic radiation therapy: Secondary | ICD-10-CM | POA: Diagnosis not present

## 2022-06-13 DIAGNOSIS — Z170 Estrogen receptor positive status [ER+]: Secondary | ICD-10-CM | POA: Diagnosis not present

## 2022-06-13 DIAGNOSIS — C50511 Malignant neoplasm of lower-outer quadrant of right female breast: Secondary | ICD-10-CM | POA: Diagnosis not present

## 2022-06-13 LAB — RAD ONC ARIA SESSION SUMMARY
Course Elapsed Days: 3
Plan Fractions Treated to Date: 4
Plan Prescribed Dose Per Fraction: 2.67 Gy
Plan Total Fractions Prescribed: 15
Plan Total Prescribed Dose: 40.05 Gy
Reference Point Dosage Given to Date: 10.68 Gy
Reference Point Session Dosage Given: 2.67 Gy
Session Number: 4

## 2022-06-16 ENCOUNTER — Other Ambulatory Visit: Payer: Self-pay

## 2022-06-16 ENCOUNTER — Ambulatory Visit
Admission: RE | Admit: 2022-06-16 | Discharge: 2022-06-16 | Disposition: A | Discharge status: Home / Self Care | Payer: Medicare PPO | Source: Ambulatory Visit | Attending: Radiation Oncology | Admitting: Radiation Oncology

## 2022-06-16 DIAGNOSIS — Z79811 Long term (current) use of aromatase inhibitors: Secondary | ICD-10-CM | POA: Insufficient documentation

## 2022-06-16 DIAGNOSIS — C50511 Malignant neoplasm of lower-outer quadrant of right female breast: Secondary | ICD-10-CM | POA: Diagnosis not present

## 2022-06-16 DIAGNOSIS — Z510 Encounter for antineoplastic radiation therapy: Secondary | ICD-10-CM | POA: Diagnosis not present

## 2022-06-16 DIAGNOSIS — Z170 Estrogen receptor positive status [ER+]: Secondary | ICD-10-CM | POA: Diagnosis not present

## 2022-06-16 LAB — RAD ONC ARIA SESSION SUMMARY
Course Elapsed Days: 6
Plan Fractions Treated to Date: 5
Plan Prescribed Dose Per Fraction: 2.67 Gy
Plan Total Fractions Prescribed: 15
Plan Total Prescribed Dose: 40.05 Gy
Reference Point Dosage Given to Date: 13.35 Gy
Reference Point Session Dosage Given: 2.67 Gy
Session Number: 5

## 2022-06-17 ENCOUNTER — Ambulatory Visit
Admission: RE | Admit: 2022-06-17 | Discharge: 2022-06-17 | Disposition: A | Discharge status: Home / Self Care | Payer: Medicare PPO | Source: Ambulatory Visit | Attending: Radiation Oncology | Admitting: Radiation Oncology

## 2022-06-17 ENCOUNTER — Other Ambulatory Visit: Payer: Self-pay

## 2022-06-17 DIAGNOSIS — Z79811 Long term (current) use of aromatase inhibitors: Secondary | ICD-10-CM | POA: Diagnosis not present

## 2022-06-17 DIAGNOSIS — Z510 Encounter for antineoplastic radiation therapy: Secondary | ICD-10-CM | POA: Diagnosis not present

## 2022-06-17 DIAGNOSIS — C50511 Malignant neoplasm of lower-outer quadrant of right female breast: Secondary | ICD-10-CM | POA: Diagnosis not present

## 2022-06-17 DIAGNOSIS — Z170 Estrogen receptor positive status [ER+]: Secondary | ICD-10-CM | POA: Diagnosis not present

## 2022-06-17 LAB — RAD ONC ARIA SESSION SUMMARY
Course Elapsed Days: 7
Plan Fractions Treated to Date: 6
Plan Prescribed Dose Per Fraction: 2.67 Gy
Plan Total Fractions Prescribed: 15
Plan Total Prescribed Dose: 40.05 Gy
Reference Point Dosage Given to Date: 16.02 Gy
Reference Point Session Dosage Given: 2.67 Gy
Session Number: 6

## 2022-06-18 ENCOUNTER — Other Ambulatory Visit: Payer: Self-pay

## 2022-06-18 ENCOUNTER — Ambulatory Visit
Admission: RE | Admit: 2022-06-18 | Discharge: 2022-06-18 | Disposition: A | Discharge status: Home / Self Care | Payer: Medicare PPO | Source: Ambulatory Visit | Attending: Radiation Oncology | Admitting: Radiation Oncology

## 2022-06-18 DIAGNOSIS — Z510 Encounter for antineoplastic radiation therapy: Secondary | ICD-10-CM | POA: Diagnosis not present

## 2022-06-18 DIAGNOSIS — Z79811 Long term (current) use of aromatase inhibitors: Secondary | ICD-10-CM | POA: Diagnosis not present

## 2022-06-18 DIAGNOSIS — Z170 Estrogen receptor positive status [ER+]: Secondary | ICD-10-CM | POA: Diagnosis not present

## 2022-06-18 DIAGNOSIS — C50511 Malignant neoplasm of lower-outer quadrant of right female breast: Secondary | ICD-10-CM | POA: Diagnosis not present

## 2022-06-18 LAB — RAD ONC ARIA SESSION SUMMARY
Course Elapsed Days: 8
Plan Fractions Treated to Date: 7
Plan Prescribed Dose Per Fraction: 2.67 Gy
Plan Total Fractions Prescribed: 15
Plan Total Prescribed Dose: 40.05 Gy
Reference Point Dosage Given to Date: 18.69 Gy
Reference Point Session Dosage Given: 2.67 Gy
Session Number: 7

## 2022-06-19 ENCOUNTER — Other Ambulatory Visit: Payer: Self-pay

## 2022-06-19 ENCOUNTER — Ambulatory Visit
Admission: RE | Admit: 2022-06-19 | Discharge: 2022-06-19 | Disposition: A | Discharge status: Home / Self Care | Payer: Medicare PPO | Source: Ambulatory Visit | Attending: Radiation Oncology | Admitting: Radiation Oncology

## 2022-06-19 DIAGNOSIS — C50511 Malignant neoplasm of lower-outer quadrant of right female breast: Secondary | ICD-10-CM | POA: Diagnosis not present

## 2022-06-19 DIAGNOSIS — Z510 Encounter for antineoplastic radiation therapy: Secondary | ICD-10-CM | POA: Diagnosis not present

## 2022-06-19 DIAGNOSIS — Z170 Estrogen receptor positive status [ER+]: Secondary | ICD-10-CM | POA: Diagnosis not present

## 2022-06-19 DIAGNOSIS — Z79811 Long term (current) use of aromatase inhibitors: Secondary | ICD-10-CM | POA: Diagnosis not present

## 2022-06-19 LAB — RAD ONC ARIA SESSION SUMMARY
Course Elapsed Days: 9
Plan Fractions Treated to Date: 8
Plan Prescribed Dose Per Fraction: 2.67 Gy
Plan Total Fractions Prescribed: 15
Plan Total Prescribed Dose: 40.05 Gy
Reference Point Dosage Given to Date: 21.36 Gy
Reference Point Session Dosage Given: 2.67 Gy
Session Number: 8

## 2022-06-20 ENCOUNTER — Other Ambulatory Visit: Payer: Self-pay

## 2022-06-20 ENCOUNTER — Ambulatory Visit
Admission: RE | Admit: 2022-06-20 | Discharge: 2022-06-20 | Disposition: A | Discharge status: Home / Self Care | Payer: Medicare PPO | Source: Ambulatory Visit | Attending: Radiation Oncology | Admitting: Radiation Oncology

## 2022-06-20 DIAGNOSIS — Z170 Estrogen receptor positive status [ER+]: Secondary | ICD-10-CM | POA: Diagnosis not present

## 2022-06-20 DIAGNOSIS — Z510 Encounter for antineoplastic radiation therapy: Secondary | ICD-10-CM | POA: Diagnosis not present

## 2022-06-20 DIAGNOSIS — C50511 Malignant neoplasm of lower-outer quadrant of right female breast: Secondary | ICD-10-CM | POA: Diagnosis not present

## 2022-06-20 DIAGNOSIS — Z79811 Long term (current) use of aromatase inhibitors: Secondary | ICD-10-CM | POA: Diagnosis not present

## 2022-06-20 LAB — RAD ONC ARIA SESSION SUMMARY
Course Elapsed Days: 10
Plan Fractions Treated to Date: 9
Plan Prescribed Dose Per Fraction: 2.67 Gy
Plan Total Fractions Prescribed: 15
Plan Total Prescribed Dose: 40.05 Gy
Reference Point Dosage Given to Date: 24.03 Gy
Reference Point Session Dosage Given: 2.67 Gy
Session Number: 9

## 2022-06-23 ENCOUNTER — Other Ambulatory Visit: Payer: Self-pay

## 2022-06-23 ENCOUNTER — Ambulatory Visit
Admission: RE | Admit: 2022-06-23 | Discharge: 2022-06-23 | Disposition: A | Discharge status: Home / Self Care | Payer: Medicare PPO | Source: Ambulatory Visit | Attending: Radiation Oncology | Admitting: Radiation Oncology

## 2022-06-23 DIAGNOSIS — Z79811 Long term (current) use of aromatase inhibitors: Secondary | ICD-10-CM | POA: Diagnosis not present

## 2022-06-23 DIAGNOSIS — Z510 Encounter for antineoplastic radiation therapy: Secondary | ICD-10-CM | POA: Diagnosis not present

## 2022-06-23 DIAGNOSIS — C50511 Malignant neoplasm of lower-outer quadrant of right female breast: Secondary | ICD-10-CM | POA: Diagnosis not present

## 2022-06-23 DIAGNOSIS — Z170 Estrogen receptor positive status [ER+]: Secondary | ICD-10-CM | POA: Diagnosis not present

## 2022-06-23 LAB — RAD ONC ARIA SESSION SUMMARY
Course Elapsed Days: 13
Plan Fractions Treated to Date: 10
Plan Prescribed Dose Per Fraction: 2.67 Gy
Plan Total Fractions Prescribed: 15
Plan Total Prescribed Dose: 40.05 Gy
Reference Point Dosage Given to Date: 26.7 Gy
Reference Point Session Dosage Given: 2.67 Gy
Session Number: 10

## 2022-06-24 ENCOUNTER — Other Ambulatory Visit: Payer: Self-pay

## 2022-06-24 ENCOUNTER — Ambulatory Visit: Payer: Medicare PPO | Admitting: Radiation Oncology

## 2022-06-24 ENCOUNTER — Ambulatory Visit
Admission: RE | Admit: 2022-06-24 | Discharge: 2022-06-24 | Disposition: A | Discharge status: Home / Self Care | Payer: Medicare PPO | Source: Ambulatory Visit | Attending: Radiation Oncology | Admitting: Radiation Oncology

## 2022-06-24 DIAGNOSIS — C50511 Malignant neoplasm of lower-outer quadrant of right female breast: Secondary | ICD-10-CM | POA: Diagnosis not present

## 2022-06-24 DIAGNOSIS — Z79811 Long term (current) use of aromatase inhibitors: Secondary | ICD-10-CM | POA: Diagnosis not present

## 2022-06-24 DIAGNOSIS — Z170 Estrogen receptor positive status [ER+]: Secondary | ICD-10-CM | POA: Diagnosis not present

## 2022-06-24 DIAGNOSIS — Z510 Encounter for antineoplastic radiation therapy: Secondary | ICD-10-CM | POA: Diagnosis not present

## 2022-06-24 LAB — RAD ONC ARIA SESSION SUMMARY
Course Elapsed Days: 14
Plan Fractions Treated to Date: 11
Plan Prescribed Dose Per Fraction: 2.67 Gy
Plan Total Fractions Prescribed: 15
Plan Total Prescribed Dose: 40.05 Gy
Reference Point Dosage Given to Date: 29.37 Gy
Reference Point Session Dosage Given: 2.67 Gy
Session Number: 11

## 2022-06-25 ENCOUNTER — Ambulatory Visit
Admission: RE | Admit: 2022-06-25 | Discharge: 2022-06-25 | Disposition: A | Discharge status: Home / Self Care | Payer: Medicare PPO | Source: Ambulatory Visit | Attending: Radiation Oncology | Admitting: Radiation Oncology

## 2022-06-25 ENCOUNTER — Other Ambulatory Visit: Payer: Self-pay

## 2022-06-25 DIAGNOSIS — C50511 Malignant neoplasm of lower-outer quadrant of right female breast: Secondary | ICD-10-CM | POA: Diagnosis not present

## 2022-06-25 DIAGNOSIS — Z170 Estrogen receptor positive status [ER+]: Secondary | ICD-10-CM | POA: Diagnosis not present

## 2022-06-25 DIAGNOSIS — Z79811 Long term (current) use of aromatase inhibitors: Secondary | ICD-10-CM | POA: Diagnosis not present

## 2022-06-25 DIAGNOSIS — Z510 Encounter for antineoplastic radiation therapy: Secondary | ICD-10-CM | POA: Diagnosis not present

## 2022-06-25 LAB — RAD ONC ARIA SESSION SUMMARY
Course Elapsed Days: 15
Plan Fractions Treated to Date: 12
Plan Prescribed Dose Per Fraction: 2.67 Gy
Plan Total Fractions Prescribed: 15
Plan Total Prescribed Dose: 40.05 Gy
Reference Point Dosage Given to Date: 32.04 Gy
Reference Point Session Dosage Given: 2.67 Gy
Session Number: 12

## 2022-06-26 ENCOUNTER — Other Ambulatory Visit: Payer: Self-pay

## 2022-06-26 ENCOUNTER — Ambulatory Visit
Admission: RE | Admit: 2022-06-26 | Discharge: 2022-06-26 | Disposition: A | Discharge status: Home / Self Care | Payer: Medicare PPO | Source: Ambulatory Visit | Attending: Radiation Oncology | Admitting: Radiation Oncology

## 2022-06-26 DIAGNOSIS — C50511 Malignant neoplasm of lower-outer quadrant of right female breast: Secondary | ICD-10-CM | POA: Diagnosis not present

## 2022-06-26 DIAGNOSIS — Z510 Encounter for antineoplastic radiation therapy: Secondary | ICD-10-CM | POA: Diagnosis not present

## 2022-06-26 DIAGNOSIS — Z170 Estrogen receptor positive status [ER+]: Secondary | ICD-10-CM | POA: Diagnosis not present

## 2022-06-26 DIAGNOSIS — Z79811 Long term (current) use of aromatase inhibitors: Secondary | ICD-10-CM | POA: Diagnosis not present

## 2022-06-26 LAB — RAD ONC ARIA SESSION SUMMARY
Course Elapsed Days: 16
Plan Fractions Treated to Date: 13
Plan Prescribed Dose Per Fraction: 2.67 Gy
Plan Total Fractions Prescribed: 15
Plan Total Prescribed Dose: 40.05 Gy
Reference Point Dosage Given to Date: 34.71 Gy
Reference Point Session Dosage Given: 2.67 Gy
Session Number: 13

## 2022-06-27 ENCOUNTER — Other Ambulatory Visit: Payer: Self-pay

## 2022-06-27 ENCOUNTER — Ambulatory Visit
Admission: RE | Admit: 2022-06-27 | Discharge: 2022-06-27 | Disposition: A | Discharge status: Home / Self Care | Payer: Medicare PPO | Source: Ambulatory Visit | Attending: Radiation Oncology | Admitting: Radiation Oncology

## 2022-06-27 DIAGNOSIS — Z79811 Long term (current) use of aromatase inhibitors: Secondary | ICD-10-CM | POA: Diagnosis not present

## 2022-06-27 DIAGNOSIS — Z510 Encounter for antineoplastic radiation therapy: Secondary | ICD-10-CM | POA: Diagnosis not present

## 2022-06-27 DIAGNOSIS — Z170 Estrogen receptor positive status [ER+]: Secondary | ICD-10-CM | POA: Diagnosis not present

## 2022-06-27 DIAGNOSIS — C50511 Malignant neoplasm of lower-outer quadrant of right female breast: Secondary | ICD-10-CM | POA: Diagnosis not present

## 2022-06-27 LAB — RAD ONC ARIA SESSION SUMMARY
Course Elapsed Days: 17
Plan Fractions Treated to Date: 14
Plan Prescribed Dose Per Fraction: 2.67 Gy
Plan Total Fractions Prescribed: 15
Plan Total Prescribed Dose: 40.05 Gy
Reference Point Dosage Given to Date: 37.38 Gy
Reference Point Session Dosage Given: 2.67 Gy
Session Number: 14

## 2022-06-30 ENCOUNTER — Ambulatory Visit: Payer: Medicare PPO

## 2022-06-30 ENCOUNTER — Ambulatory Visit
Admission: RE | Admit: 2022-06-30 | Discharge: 2022-06-30 | Disposition: A | Discharge status: Home / Self Care | Payer: Medicare PPO | Source: Ambulatory Visit | Attending: Radiation Oncology | Admitting: Radiation Oncology

## 2022-06-30 ENCOUNTER — Other Ambulatory Visit: Payer: Self-pay

## 2022-06-30 DIAGNOSIS — Z510 Encounter for antineoplastic radiation therapy: Secondary | ICD-10-CM | POA: Diagnosis not present

## 2022-06-30 DIAGNOSIS — Z170 Estrogen receptor positive status [ER+]: Secondary | ICD-10-CM | POA: Diagnosis not present

## 2022-06-30 DIAGNOSIS — C50511 Malignant neoplasm of lower-outer quadrant of right female breast: Secondary | ICD-10-CM | POA: Diagnosis not present

## 2022-06-30 DIAGNOSIS — Z79811 Long term (current) use of aromatase inhibitors: Secondary | ICD-10-CM | POA: Diagnosis not present

## 2022-06-30 LAB — RAD ONC ARIA SESSION SUMMARY
Course Elapsed Days: 20
Plan Fractions Treated to Date: 15
Plan Prescribed Dose Per Fraction: 2.67 Gy
Plan Total Fractions Prescribed: 15
Plan Total Prescribed Dose: 40.05 Gy
Reference Point Dosage Given to Date: 40.05 Gy
Reference Point Session Dosage Given: 2.67 Gy
Session Number: 15

## 2022-07-01 ENCOUNTER — Ambulatory Visit
Admission: RE | Admit: 2022-07-01 | Discharge: 2022-07-01 | Disposition: A | Discharge status: Home / Self Care | Payer: Medicare PPO | Source: Ambulatory Visit | Attending: Radiation Oncology | Admitting: Radiation Oncology

## 2022-07-01 ENCOUNTER — Other Ambulatory Visit: Payer: Self-pay

## 2022-07-01 DIAGNOSIS — Z79811 Long term (current) use of aromatase inhibitors: Secondary | ICD-10-CM | POA: Diagnosis not present

## 2022-07-01 DIAGNOSIS — C50511 Malignant neoplasm of lower-outer quadrant of right female breast: Secondary | ICD-10-CM | POA: Diagnosis not present

## 2022-07-01 DIAGNOSIS — Z510 Encounter for antineoplastic radiation therapy: Secondary | ICD-10-CM | POA: Diagnosis not present

## 2022-07-01 DIAGNOSIS — Z170 Estrogen receptor positive status [ER+]: Secondary | ICD-10-CM | POA: Diagnosis not present

## 2022-07-01 LAB — RAD ONC ARIA SESSION SUMMARY
Course Elapsed Days: 21
Plan Fractions Treated to Date: 1
Plan Prescribed Dose Per Fraction: 2 Gy
Plan Total Fractions Prescribed: 6
Plan Total Prescribed Dose: 12 Gy
Reference Point Dosage Given to Date: 2 Gy
Reference Point Session Dosage Given: 2 Gy
Session Number: 16

## 2022-07-02 ENCOUNTER — Other Ambulatory Visit: Payer: Self-pay

## 2022-07-02 ENCOUNTER — Ambulatory Visit
Admission: RE | Admit: 2022-07-02 | Discharge: 2022-07-02 | Disposition: A | Discharge status: Home / Self Care | Payer: Medicare PPO | Source: Ambulatory Visit | Attending: Radiation Oncology | Admitting: Radiation Oncology

## 2022-07-02 DIAGNOSIS — Z510 Encounter for antineoplastic radiation therapy: Secondary | ICD-10-CM | POA: Diagnosis not present

## 2022-07-02 DIAGNOSIS — Z79811 Long term (current) use of aromatase inhibitors: Secondary | ICD-10-CM | POA: Diagnosis not present

## 2022-07-02 DIAGNOSIS — C50511 Malignant neoplasm of lower-outer quadrant of right female breast: Secondary | ICD-10-CM | POA: Diagnosis not present

## 2022-07-02 DIAGNOSIS — Z170 Estrogen receptor positive status [ER+]: Secondary | ICD-10-CM | POA: Diagnosis not present

## 2022-07-02 LAB — RAD ONC ARIA SESSION SUMMARY
Course Elapsed Days: 22
Plan Fractions Treated to Date: 2
Plan Prescribed Dose Per Fraction: 2 Gy
Plan Total Fractions Prescribed: 6
Plan Total Prescribed Dose: 12 Gy
Reference Point Dosage Given to Date: 4 Gy
Reference Point Session Dosage Given: 2 Gy
Session Number: 17

## 2022-07-03 ENCOUNTER — Other Ambulatory Visit: Payer: Self-pay

## 2022-07-03 ENCOUNTER — Ambulatory Visit
Admission: RE | Admit: 2022-07-03 | Discharge: 2022-07-03 | Disposition: A | Discharge status: Home / Self Care | Payer: Medicare PPO | Source: Ambulatory Visit | Attending: Radiation Oncology | Admitting: Radiation Oncology

## 2022-07-03 ENCOUNTER — Inpatient Hospital Stay: Payer: Medicare PPO | Admitting: Hematology

## 2022-07-03 ENCOUNTER — Encounter: Payer: Self-pay | Admitting: Hematology

## 2022-07-03 VITALS — BP 132/58 | HR 61 | Temp 97.6°F | Resp 18 | Ht 65.0 in | Wt 192.4 lb

## 2022-07-03 DIAGNOSIS — Z170 Estrogen receptor positive status [ER+]: Secondary | ICD-10-CM | POA: Diagnosis not present

## 2022-07-03 DIAGNOSIS — Z510 Encounter for antineoplastic radiation therapy: Secondary | ICD-10-CM | POA: Diagnosis not present

## 2022-07-03 DIAGNOSIS — C50511 Malignant neoplasm of lower-outer quadrant of right female breast: Secondary | ICD-10-CM | POA: Insufficient documentation

## 2022-07-03 DIAGNOSIS — Z79811 Long term (current) use of aromatase inhibitors: Secondary | ICD-10-CM | POA: Diagnosis not present

## 2022-07-03 LAB — RAD ONC ARIA SESSION SUMMARY
Course Elapsed Days: 23
Plan Fractions Treated to Date: 3
Plan Prescribed Dose Per Fraction: 2 Gy
Plan Total Fractions Prescribed: 6
Plan Total Prescribed Dose: 12 Gy
Reference Point Dosage Given to Date: 6 Gy
Reference Point Session Dosage Given: 2 Gy
Session Number: 18

## 2022-07-03 MED ORDER — EXEMESTANE 25 MG PO TABS: 25.0000 mg | ORAL_TABLET | Freq: Every day | ORAL | 3 refills | Status: DC

## 2022-07-03 NOTE — Assessment & Plan Note (Signed)
pT1cN0M0, stage IA, er+/pr+/her2-, RS 13 --I discussed her surgical path result in details -the Oncotype Dx result was reviewed with her in details. She has low risk based on the recurrence score, which predicts 10 year distant recurrence after 5 years of tamoxifen 4%. There is no benefit for adjuvant chemo --Given the strong ER and PR positivity, I do recommend adjuvant aromatase inhibitor to reduce her risk of cancer recurrence,  The potential benefit and side effects, which includes but not limited to, hot flash, skin and vaginal dryness, metabolic changes ( increased blood glucose, cholesterol, weight, etc.), slightly in increased risk of cardiovascular disease, cataracts, muscular and joint discomfort, osteopenia and osteoporosis, etc, were discussed with her in great details. She is interested, and we'll start after she completes radiation. -We also discussed the breast cancer surveillance after her surgery. She will continue annual screening mammogram, self exam, and a routine office visit with lab and exam with [place]. -I encouraged her to have healthy diet and exercise regularly.

## 2022-07-03 NOTE — Progress Notes (Signed)
[facility]   Telephone:([phone] Fax:([phone]   Clinic Follow up Note   Patient Care Team: Sigmund Hazel, MD as PCP - General (Family Medicine) Griselda Miner, MD as Consulting Physician (General Surgery) Malachy Mood, MD as Consulting Physician (Hematology) Antony Blackbird, MD as Consulting Physician (Radiation Oncology) Pershing Proud, RN as Oncology Nurse Navigator Donnelly Angelica, RN as Oncology Nurse Navigator  Date of Service:  07/03/2022  CHIEF COMPLAINT: f/u of Right  Breast Cancer, ER+   CURRENT THERAPY:  Exemestane starting 07/2022  ASSESSMENT:  Kaitlyn Reed is a 72 y.o. female with   Malignant neoplasm of lower-outer quadrant of right breast of female, estrogen receptor positive (HCC) pT1cN0M0, stage IA, er+/pr+/her2-, RS 13 --I discussed her surgical path result in details -the Oncotype Dx result was reviewed with her in details. She has low risk based on the recurrence score, which predicts 10 year distant recurrence after 5 years of tamoxifen 4%. There is no benefit for adjuvant chemo --Given the strong ER and PR positivity, I do recommend adjuvant aromatase inhibitor to reduce her risk of cancer recurrence,  The potential benefit and side effects, which includes but not limited to, hot flash, skin and vaginal dryness, metabolic changes ( increased blood glucose, cholesterol, weight, etc.), slightly in increased risk of cardiovascular disease, cataracts, muscular and joint discomfort, osteopenia and osteoporosis, etc, were discussed with her in great details. She is interested, and we'll start after she completes radiation. -We also discussed the breast cancer surveillance after her surgery. She will continue annual screening mammogram, self exam, and a routine office visit with lab and exam with [place]. -I encouraged her to have healthy diet and exercise regularly.      PLAN: -I reviewed surgical pathology and the Oncotype results with pt -I  recommend adjuvant exemestane, I called into her pharmacy today, she will start in 2-4 weeks  - Pt agree to antiestrogen therapy -I encourage pt to stay active and eat healthy. Survivorship in 3 months -lab and f/u in 6 months  SUMMARY OF ONCOLOGIC HISTORY: Oncology History Overview Note   Cancer Staging  Malignant neoplasm of lower-outer quadrant of right breast of female, estrogen receptor positive (HCC) Staging form: Breast, AJCC 8th Edition - Clinical stage from 03/18/2022: Stage IA (cT1b, cN0, cM0, G1, ER+, PR+, HER2-) - Signed by Malachy Mood, MD on 03/26/2022 Stage prefix: Initial diagnosis Histologic grading system: 3 grade system     Malignant neoplasm of lower-outer quadrant of right breast of female, estrogen receptor positive (HCC)  03/18/2022 Cancer Staging   Staging form: Breast, AJCC 8th Edition - Clinical stage from 03/18/2022: Stage IA (cT1b, cN0, cM0, G1, ER+, PR+, HER2-) - Signed by Malachy Mood, MD on 03/26/2022 Stage prefix: Initial diagnosis Histologic grading system: 3 grade system   03/25/2022 Initial Diagnosis   Malignant neoplasm of lower-outer quadrant of right breast of female, estrogen receptor positive (HCC)    Genetic Testing   [facility] Custom Cancer Panel+RNA was Negative. Report date is 04/01/2022.   The Custom Hereditary Cancers Panel offered by [facility] includes sequencing and/or deletion duplication testing of the following 45 genes: APC, ATM, AXIN2, BAP1, BARD1, BMPR1A, BRCA1, BRCA2, BRIP1, CDH1, CDK4, CDKN2A (p14ARF and p16INK4a only), CHEK2, CTNNA1, EPCAM (Deletion/duplication testing only), FH, GREM1 (promoter region duplication testing only), HOXB13, KIT, MBD4, MEN1, MITF, MLH1, MSH2, MSH3, MSH6, MUTYH, NF1, NHTL1, PALB2, PDGFRA, PMS2, POLD1, POLE, POT1, PTEN, RAD51C, RAD51D, SMAD4, SMARCA4. STK11, TP53, TSC1, TSC2, and VHL.  04/30/2022 Cancer Staging   Staging form: Breast, AJCC 8th Edition - Pathologic stage from 04/30/2022: Stage IA (pT1c, pN0, cM0, G1, ER+,  PR+, HER2-, Oncotype DX score: 13) - Signed by Malachy Mood, MD on 07/03/2022 Multigene prognostic tests performed: Oncotype DX Recurrence score range: Greater than or equal to 11 Histologic grading system: 3 grade system Residual tumor (R): R0 - None   04/30/2022 Pathology Results    FINAL MICROSCOPIC DIAGNOSIS:  A. 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  Negative for DCIS     - Closest margin, invasive: Inferior and Anterior, 1.0 mm; Superior, 3.0 mm     - Closet margin, DCIS: Superior and Anterior, 2.0 mm; Inferior, 3.0 mm - Lymphovascular invasion (LVI): Not identified - Calcifications: Present - Prognostic markers:  ER positive (95%), PR positive (95%), Her2 negative (0), Ki-67 (1%) - Other: Small intraductal papilloma with usual ductal hyperplasia (UDH), fibrocystic changes including apocrine metaplasia, usual ductal hyperplasia (UDH) with associated microcalcifications, adenosis and biopsy site changes. - See oncology table and comment  INVASIVE CARCINOMA OF THE BREAST:  Resection  Procedure: Lumpectomy Specimen Laterality: Right Histologic Type: Ductal carcinoma Histologic Grade:      Glandular (Acinar)/Tubular Differentiation: 2      Nuclear Pleomorphism: 2      Mitotic Rate: 1      Overall Grade: 1 Tumor Size: 1.4 cm Ductal Carcinoma In Situ: Present Lymphatic and/or Vascular Invasion: Not identified Treatment Effect in the Breast: No known presurgical therapy Margins: All margins negative for invasive carcinoma      Distance from Closest Margin (mm): 1.0 mm      Specify Closest Margin (required only if <70mm): Inferior and Anterior, 1.0 mm; Superior, 3.0 mm DCIS Margins: Uninvolved by DCIS      Distance from Closest Margin (mm): 2.0 mm      Specify Closest Margin (required only if <7mm):  Superior and Anterior, 2.0 mm; Inferior, 3.0 mm Regional Lymph Nodes: Not applicable (no lymph nodes submitted or found)      Distant Metastasis: N/A      Distant Site(s) Involved: N/A Breast Biomarker Testing Performed on Previous Biopsy:      Testing Performed on Case Number: [id number]            Estrogen Receptor: 95%, positive, strong staining intensity            Progesterone Receptor: 95%, positive, strong staining intensity            HER2: Negative (0)            Ki-67: 1% Pathologic Stage Classification (pTNM, AJCC 8th Edition): pT1c, pNx Representative Tumor Block: A2 Comment(s): Immunohistochemical staining for CK5/6 and ER is performed on blocks A2 and A4.  The staining patterns are consistent with the above diagnosis.  Immunohistochemical staining for CK5/6 and ER is performed on block A8 and the results are noncontributory. (v4.5.0.0)       INTERVAL HISTORY:  Kaitlyn Reed is here for a follow up of Right  Breast Cancer, ER+. She was last seen by me on 03/26/2022. She presents to the clinic alone. Pt state that her surgery went well, and radiation is going good. Pt reports that she is starting to feel the fatigue. Pt state that she has some joint pain and arthritis.    All other systems were reviewed  with the patient and are negative.  MEDICAL HISTORY:  Past Medical History:  Diagnosis Date   Anemia    Hypertension     SURGICAL HISTORY: Past Surgical History:  Procedure Laterality Date   ABDOMINAL HYSTERECTOMY     BREAST BIOPSY Right 03/18/2022   [place] RT BREAST BX W LOC DEV 1ST LESION IMG BX SPEC US GUIDE 03/18/2022 [facility] MAMMOGRAPHY   BREAST BIOPSY  04/29/2022   MM RT RADIOACTIVE SEED LOC MAMMO GUIDE 04/29/2022 [facility] MAMMOGR[facility]   BREAST LUMPECTOMY WITH RADIOACTIVE SEED LOCALIZATION Right 04/30/2022   Procedure: RIGHT BREAST LUMPECTOMY WITH RADIOACTIVE SEED LOCALIZATION;  Surgeon: Griselda Miner, MD;  Location: [facility] OR;  Service: General;  Laterality:  Right;   CESAREAN SECTION     CHOLECYSTECTOMY     EYE SURGERY     bilateral cataracts   PARTIAL HYSTERECTOMY      I have reviewed the social history and family history with the patient and they are unchanged from previous note.  ALLERGIES:  is allergic to erythromycin, no healthtouch food allergies, and codeine.  MEDICATIONS:  Current Outpatient Medications  Medication Sig Dispense Refill   exemestane (AROMASIN) 25 MG tablet Take 1 tablet (25 mg total) by mouth daily after breakfast. 30 tablet 3   hydrochlorothiazide (HYDRODIURIL) 25 MG tablet Take 25 mg by mouth in the morning.     metoprolol succinate (TOPROL-XL) 25 MG 24 hr tablet Take 25 mg by mouth in the morning.     Multiple Vitamin (MULTIVITAMIN WITH MINERALS) TABS tablet Take 1 tablet by mouth in the morning.     traMADol (ULTRAM) 50 MG tablet Take 1 tablet (50 mg total) by mouth every 6 (six) hours as needed. 15 tablet 0   No current facility-administered medications for this visit.    PHYSICAL EXAMINATION: ECOG PERFORMANCE STATUS: 0 - Asymptomatic  Vitals:   07/03/22 0906  BP: (!) 132/58  Pulse: 61  Resp: 18  Temp: 97.6 F (36.4 C)  SpO2: 98%   Wt Readings from Last 3 Encounters:  07/03/22 192 lb 6.4 oz (87.3 kg)  05/27/22 198 lb (89.8 kg)  04/30/22 194 lb (88 kg)     GENERAL:alert, no distress and comfortable SKIN: skin color, texture, turgor are normal, no rashes or significant lesions EYES: normal, Conjunctiva are pink and non-injected, sclera clear NECK: supple, thyroid normal size, non-tender, without nodularity LYMPH:  no palpable lymphadenopathy in the cervical, axillary  LUNGS: clear to auscultation and percussion with normal breathing effort HEART: regular rate & rhythm and no murmurs and no lower extremity edema ABDOMEN:abdomen soft, non-tender and normal bowel sounds Musculoskeletal:no cyanosis of digits and no clubbing  NEURO: alert & oriented x 3 with fluent speech, no focal motor/sensory  deficits  LABORATORY DATA:  I have reviewed the data as listed    Latest Ref Rng & Units 04/23/2022    8:48 AM 03/28/2022    9:57 AM 03/26/2022   12:00 PM  CBC  WBC 4.0 - 10.5 K/uL 7.7  7.1  10.5   Hemoglobin 12.0 - 15.0 g/dL 16.1  09.6  04.5   Hematocrit 36.0 - 46.0 % 39.0  37.0  39.7   Platelets 150 - 400 K/uL 253  239  251         Latest Ref Rng & Units 04/23/2022    8:48 AM 03/28/2022    9:57 AM 03/26/2022   12:00 PM  CMP  Glucose 70 - 99 mg/dL 409  811  97   BUN  8 - 23 mg/dL 14  17  15    Creatinine 0.44 - 1.00 mg/dL 4.09  8.11  9.14   Sodium 135 - 145 mmol/L 138  140  141   Potassium 3.5 - 5.1 mmol/L 3.9  3.8  4.0   Chloride 98 - 111 mmol/L 103  104  102   CO2 22 - 32 mmol/L 27  29  33   Calcium 8.9 - 10.3 mg/dL 9.3  9.9  78.2   Total Protein 6.5 - 8.1 g/dL  7.8  8.1   Total Bilirubin 0.3 - 1.2 mg/dL  0.5  0.5   Alkaline Phos 38 - 126 U/L  64  68   AST 15 - 41 U/L  26  25   ALT 0 - 44 U/L  19  19       RADIOGRAPHIC STUDIES: I have personally reviewed the radiological images as listed and agreed with the findings in the report. No results found.    No orders of the defined types were placed in this encounter.  All questions were answered. The patient knows to call the clinic with any problems, questions or concerns. No barriers to learning was detected. The total time spent in the appointment was 30 minutes.     Malachy Mood, MD 07/03/2022   Carolin Coy, CMA, am acting as scribe for Malachy Mood, MD.   I have reviewed the above documentation for accuracy and completeness, and I agree with the above.

## 2022-07-04 ENCOUNTER — Ambulatory Visit
Admission: RE | Admit: 2022-07-04 | Discharge: 2022-07-04 | Disposition: A | Discharge status: Home / Self Care | Payer: Medicare PPO | Source: Ambulatory Visit | Attending: Radiation Oncology | Admitting: Radiation Oncology

## 2022-07-04 ENCOUNTER — Other Ambulatory Visit: Payer: Self-pay

## 2022-07-04 DIAGNOSIS — C50511 Malignant neoplasm of lower-outer quadrant of right female breast: Secondary | ICD-10-CM | POA: Diagnosis not present

## 2022-07-04 DIAGNOSIS — Z510 Encounter for antineoplastic radiation therapy: Secondary | ICD-10-CM | POA: Diagnosis not present

## 2022-07-04 DIAGNOSIS — Z170 Estrogen receptor positive status [ER+]: Secondary | ICD-10-CM | POA: Diagnosis not present

## 2022-07-04 DIAGNOSIS — Z79811 Long term (current) use of aromatase inhibitors: Secondary | ICD-10-CM | POA: Diagnosis not present

## 2022-07-04 LAB — RAD ONC ARIA SESSION SUMMARY
Course Elapsed Days: 24
Plan Fractions Treated to Date: 4
Plan Prescribed Dose Per Fraction: 2 Gy
Plan Total Fractions Prescribed: 6
Plan Total Prescribed Dose: 12 Gy
Reference Point Dosage Given to Date: 8 Gy
Reference Point Session Dosage Given: 2 Gy
Session Number: 19

## 2022-07-07 ENCOUNTER — Other Ambulatory Visit: Payer: Self-pay

## 2022-07-07 ENCOUNTER — Ambulatory Visit
Admission: RE | Admit: 2022-07-07 | Discharge: 2022-07-07 | Disposition: A | Discharge status: Home / Self Care | Payer: Medicare PPO | Source: Ambulatory Visit | Attending: Radiation Oncology | Admitting: Radiation Oncology

## 2022-07-07 DIAGNOSIS — Z510 Encounter for antineoplastic radiation therapy: Secondary | ICD-10-CM | POA: Diagnosis not present

## 2022-07-07 DIAGNOSIS — Z170 Estrogen receptor positive status [ER+]: Secondary | ICD-10-CM | POA: Diagnosis not present

## 2022-07-07 DIAGNOSIS — C50511 Malignant neoplasm of lower-outer quadrant of right female breast: Secondary | ICD-10-CM | POA: Diagnosis not present

## 2022-07-07 DIAGNOSIS — Z79811 Long term (current) use of aromatase inhibitors: Secondary | ICD-10-CM | POA: Diagnosis not present

## 2022-07-07 LAB — RAD ONC ARIA SESSION SUMMARY
Course Elapsed Days: 27
Plan Fractions Treated to Date: 5
Plan Prescribed Dose Per Fraction: 2 Gy
Plan Total Fractions Prescribed: 6
Plan Total Prescribed Dose: 12 Gy
Reference Point Dosage Given to Date: 10 Gy
Reference Point Session Dosage Given: 2 Gy
Session Number: 20

## 2022-07-08 ENCOUNTER — Ambulatory Visit
Admission: RE | Admit: 2022-07-08 | Discharge: 2022-07-08 | Disposition: A | Discharge status: Home / Self Care | Payer: Medicare PPO | Source: Ambulatory Visit | Attending: Radiation Oncology | Admitting: Radiation Oncology

## 2022-07-08 ENCOUNTER — Other Ambulatory Visit: Payer: Self-pay

## 2022-07-08 ENCOUNTER — Ambulatory Visit: Payer: Medicare PPO

## 2022-07-08 DIAGNOSIS — Z510 Encounter for antineoplastic radiation therapy: Secondary | ICD-10-CM | POA: Diagnosis not present

## 2022-07-08 DIAGNOSIS — C50511 Malignant neoplasm of lower-outer quadrant of right female breast: Secondary | ICD-10-CM | POA: Diagnosis not present

## 2022-07-08 DIAGNOSIS — Z170 Estrogen receptor positive status [ER+]: Secondary | ICD-10-CM | POA: Diagnosis not present

## 2022-07-08 DIAGNOSIS — Z79811 Long term (current) use of aromatase inhibitors: Secondary | ICD-10-CM | POA: Diagnosis not present

## 2022-07-08 LAB — RAD ONC ARIA SESSION SUMMARY
Course Elapsed Days: 28
Plan Fractions Treated to Date: 6
Plan Prescribed Dose Per Fraction: 2 Gy
Plan Total Fractions Prescribed: 6
Plan Total Prescribed Dose: 12 Gy
Reference Point Dosage Given to Date: 12 Gy
Reference Point Session Dosage Given: 2 Gy
Session Number: 21

## 2022-07-09 NOTE — Radiation Completion Notes (Signed)
Patient Name: Kaitlyn Reed, Kaitlyn Reed MRN: [id number] Date of Birth: [birth year] Referring Physician: Sigmund Hazel, M.D. Date of Service: 2022-07-09 Radiation Oncologist: Arnette Schaumann, M.D. [facility]                             RADIATION ONCOLOGY END OF TREATMENT NOTE     Diagnosis: C50.511 Malignant neoplasm of lower-outer quadrant of right female breast Staging on 2022-04-30: Malignant neoplasm of lower-outer quadrant of right breast of female, estrogen receptor positive (HCC) T=pT1c, N=pN0, M=cM0 Staging on 2022-03-18: Malignant neoplasm of lower-outer quadrant of right breast of female, estrogen receptor positive (HCC) T=cT1b, N=cN0, M=cM0 Intent: Curative     ==========DELIVERED PLANS==========  First Treatment Date: 2022-06-10 - Last Treatment Date: 2022-07-08   Plan Name: Breast_R Site: Breast, Right Technique: 3D Mode: Photon Dose Per Fraction: 2.67 Gy Prescribed Dose (Delivered / Prescribed): 40.05 Gy / 40.05 Gy Prescribed Fxs (Delivered / Prescribed): 15 / 15   Plan Name: Breast_R_Bst Site: Breast, Right Technique: 3D Mode: Photon Dose Per Fraction: 2 Gy Prescribed Dose (Delivered / Prescribed): 12 Gy / 12 Gy Prescribed Fxs (Delivered / Prescribed): 6 / 6     ==========ON TREATMENT VISIT DATES========== 2022-06-10, 2022-06-17, 2022-07-01, 2022-07-03, 2022-07-08     ==========UPCOMING VISITS==========       ==========APPENDIX - ON TREATMENT VISIT NOTES==========   See weekly On Treatment Notes in Epic for details.

## 2022-07-29 ENCOUNTER — Telehealth: Payer: Self-pay | Admitting: *Deleted

## 2022-07-29 NOTE — Telephone Encounter (Signed)
 RETURNED PATIENT'S PHONE CALL, SPOKE WITH PATIENT. ?

## 2022-08-04 DIAGNOSIS — H43822 Vitreomacular adhesion, left eye: Secondary | ICD-10-CM | POA: Diagnosis not present

## 2022-08-04 DIAGNOSIS — H40013 Open angle with borderline findings, low risk, bilateral: Secondary | ICD-10-CM | POA: Diagnosis not present

## 2022-08-04 DIAGNOSIS — Z79899 Other long term (current) drug therapy: Secondary | ICD-10-CM | POA: Diagnosis not present

## 2022-08-04 DIAGNOSIS — H524 Presbyopia: Secondary | ICD-10-CM | POA: Diagnosis not present

## 2022-08-04 DIAGNOSIS — H26493 Other secondary cataract, bilateral: Secondary | ICD-10-CM | POA: Diagnosis not present

## 2022-08-07 ENCOUNTER — Ambulatory Visit: Payer: Self-pay | Admitting: Radiation Oncology

## 2022-08-13 NOTE — Progress Notes (Signed)
Radiation Oncology         (336) [phone] ________________________________  Name: [name] MRN: [id number]  Date: 08/14/2022  DOB: [birth year]  Follow-Up Visit Note  CC: Kaitlyn Hazel, MD  Kaitlyn Hazel, MD  No diagnosis found.  Diagnosis: Stage IA (cT1b, cN0, cM0)  Right Breast LOQ, Invasive ductal carcinoma with intermediate grade DCIS, ER+ / PR+ / Her2-, Grade 1: s/p lumpectomy     Interval Since Last Radiation: 1 month and 7 days  Indication for treatment: Curative       Radiation treatment dates: 06/10/22 through 07/08/22  Site/dose:  1) Right breast - 40.05 Gy delivered in 15 Fx at 2.67 Gy/Fx 2) Right breast boost - 12 Gy delivered in 6 Fx at 2 Gy/Fx Beams/energy: 10X Technique/Mode: 3D / Photon   Narrative:  The patient returns today for routine follow-up.  The patient tolerated radiation treatment relatively well. On the date of her final treatment, the patient endorsed  tenderness / sensitivity to the nipple and underneath the breast fold, some mild fatigue, and some tightness with ROM which she noted as manageable. Physical exam performed on that same date showed some mild hyperpigmentation changes and some mild erythema to the right breast area.  A small area of breakdown was appreciated in the inframammary fold and was advised to place Neosporin in this area.   During her most recent follow-up visit with Dr. Mosetta Putt on 07/03/22, the patient opted to proceed with AI consisting of adjuvant exemestane. She will return to Dr. Mosetta Putt in September to review her survivorship care plan.                             ***    Allergies:  is allergic to erythromycin, no healthtouch food allergies, and codeine.  Meds: Current Outpatient Medications  Medication Sig Dispense Refill   exemestane (AROMASIN) 25 MG tablet Take 1 tablet (25 mg total) by mouth daily after breakfast. 30 tablet 3   hydrochlorothiazide (HYDRODIURIL) 25 MG tablet Take 25 mg by mouth in the morning.      metoprolol succinate (TOPROL-XL) 25 MG 24 hr tablet Take 25 mg by mouth in the morning.     Multiple Vitamin (MULTIVITAMIN WITH MINERALS) TABS tablet Take 1 tablet by mouth in the morning.     traMADol (ULTRAM) 50 MG tablet Take 1 tablet (50 mg total) by mouth every 6 (six) hours as needed. 15 tablet 0   No current facility-administered medications for this encounter.    Physical Findings: The patient is in no acute distress. Patient is alert and oriented.  vitals were not taken for this visit. .  No significant changes. Lungs are clear to auscultation bilaterally. Heart has regular rate and rhythm. No palpable cervical, supraclavicular, or axillary adenopathy. Abdomen soft, non-tender, normal bowel sounds.  Left Breast: no palpable mass, nipple discharge or bleeding. Right Breast: ***  Lab Findings: Lab Results  Component Value Date   WBC 7.7 04/23/2022   HGB 13.2 04/23/2022   HCT 39.0 04/23/2022   MCV 88.2 04/23/2022   PLT 253 04/23/2022    Radiographic Findings: No results found.  Impression: Stage IA (cT1b, cN0, cM0)  Right Breast LOQ, Invasive ductal carcinoma with intermediate grade DCIS, ER+ / PR+ / Her2-, Grade 1: s/p lumpectomy     The patient is recovering from the effects of radiation.  ***  Plan:  ***   *** minutes of total time was spent  for this patient encounter, including preparation, face-to-face counseling with the patient and coordination of care, physical exam, and documentation of the encounter. ____________________________________  Billie Lade, PhD, MD  This document serves as a record of services personally performed by Antony Blackbird, MD. It was created on his behalf by Neena Rhymes, a trained medical scribe. The creation of this record is based on the scribe's personal observations and the provider's statements to them. This document has been checked and approved by the attending provider.

## 2022-08-13 NOTE — Progress Notes (Signed)
  Radiation Oncology         (336) [phone] ________________________________  Name: Kaitlyn Reed MRN: [id number]  Date: 08/14/2022  DOB: 18-Aug-1950  End of Treatment Note  Diagnosis: Stage IA (cT1b, cN0, cM0)  Right Breast LOQ, Invasive ductal carcinoma with intermediate grade DCIS, ER+ / PR+ / Her2-, Grade 1: s/p lumpectomy      Indication for treatment: Curative        Radiation treatment dates: 06/10/22 through 07/08/22   Site/dose:  1) Right breast - 40.05 Gy delivered in 15 Fx at 2.67 Gy/Fx 2) Right breast boost - 12 Gy delivered in 6 Fx at 2 Gy/Fx  Beams/energy: 10X  Technique/Mode: 3D / Photon   Narrative: The patient tolerated radiation treatment relatively well. On the date of her final treatment, the patient endorsed  tenderness/sensitivity to the nipple and underneath the breast fold, some mild fatigue, and some tightness with ROM which she noted as manageable. Physical exam performed on that same date showed some mild hyperpigmentation changes and some mild erythema to the right breast area.  A small area of breakdown was appreciated in the inframammary fold and was advised to place Neosporin in this area.   Plan: The patient has completed radiation treatment. The patient will return to radiation oncology clinic for routine followup in one month. I advised them to call or return sooner if they have any questions or concerns related to their recovery or treatment.  -----------------------------------  Billie Lade, PhD, MD  This document serves as a record of services personally performed by Antony Blackbird, MD. It was created on his behalf by Neena Rhymes, a trained medical scribe. The creation of this record is based on the scribe's personal observations and the provider's statements to them. This document has been checked and approved by the attending provider.

## 2022-08-14 ENCOUNTER — Ambulatory Visit
Admission: RE | Admit: 2022-08-14 | Discharge: 2022-08-14 | Disposition: A | Discharge status: Home / Self Care | Payer: Medicare PPO | Source: Ambulatory Visit | Attending: Radiation Oncology | Admitting: Radiation Oncology

## 2022-08-14 ENCOUNTER — Other Ambulatory Visit: Payer: Self-pay

## 2022-08-14 VITALS — BP 125/68 | HR 72 | Temp 97.8°F | Resp 20 | Ht 65.0 in | Wt 193.2 lb

## 2022-08-14 DIAGNOSIS — Z79811 Long term (current) use of aromatase inhibitors: Secondary | ICD-10-CM | POA: Diagnosis not present

## 2022-08-14 DIAGNOSIS — Z923 Personal history of irradiation: Secondary | ICD-10-CM | POA: Diagnosis not present

## 2022-08-14 DIAGNOSIS — C50511 Malignant neoplasm of lower-outer quadrant of right female breast: Secondary | ICD-10-CM | POA: Insufficient documentation

## 2022-08-14 DIAGNOSIS — Z170 Estrogen receptor positive status [ER+]: Secondary | ICD-10-CM | POA: Insufficient documentation

## 2022-08-14 NOTE — Progress Notes (Signed)
Kaitlyn Reed is here today for follow up post radiation to the breast.   Breast Side:Right Breast    They completed their radiation on: 07/08/2022  Does the patient complain of any of the following: Post radiation skin issues: Rash  Breast Tenderness: Yes Breast Swelling: Mild Lymphadema: Mild Range of Motion limitations: No issues Fatigue post radiation: Yes, mild Appetite good/fair/poor: Great   BP 125/68 (BP Location: Right Arm, Patient Position: Sitting, Cuff Size: Large)   Pulse 72   Temp 97.8 F (36.6 C)   Resp 20   Ht 5\' 5"  (1.651 m)   Wt 193 lb 3.2 oz (87.6 kg)   SpO2 99%   BMI 32.15 kg/m

## 2022-09-17 ENCOUNTER — Encounter: Payer: Self-pay | Admitting: Adult Health

## 2022-09-17 ENCOUNTER — Inpatient Hospital Stay: Payer: Medicare PPO | Attending: Radiation Oncology | Admitting: Adult Health

## 2022-09-17 VITALS — BP 144/59 | HR 74 | Temp 97.5°F | Resp 18 | Ht 65.0 in | Wt 192.1 lb

## 2022-09-17 DIAGNOSIS — Z808 Family history of malignant neoplasm of other organs or systems: Secondary | ICD-10-CM | POA: Insufficient documentation

## 2022-09-17 DIAGNOSIS — Z801 Family history of malignant neoplasm of trachea, bronchus and lung: Secondary | ICD-10-CM | POA: Insufficient documentation

## 2022-09-17 DIAGNOSIS — Z170 Estrogen receptor positive status [ER+]: Secondary | ICD-10-CM | POA: Diagnosis not present

## 2022-09-17 DIAGNOSIS — Z79811 Long term (current) use of aromatase inhibitors: Secondary | ICD-10-CM

## 2022-09-17 DIAGNOSIS — Z13820 Encounter for screening for osteoporosis: Secondary | ICD-10-CM

## 2022-09-17 DIAGNOSIS — Z800 Family history of malignant neoplasm of digestive organs: Secondary | ICD-10-CM | POA: Insufficient documentation

## 2022-09-17 DIAGNOSIS — Z803 Family history of malignant neoplasm of breast: Secondary | ICD-10-CM | POA: Insufficient documentation

## 2022-09-17 DIAGNOSIS — Z807 Family history of other malignant neoplasms of lymphoid, hematopoietic and related tissues: Secondary | ICD-10-CM | POA: Insufficient documentation

## 2022-09-17 DIAGNOSIS — Z87891 Personal history of nicotine dependence: Secondary | ICD-10-CM | POA: Diagnosis not present

## 2022-09-17 DIAGNOSIS — M8580 Other specified disorders of bone density and structure, unspecified site: Secondary | ICD-10-CM | POA: Insufficient documentation

## 2022-09-17 DIAGNOSIS — C50511 Malignant neoplasm of lower-outer quadrant of right female breast: Secondary | ICD-10-CM | POA: Insufficient documentation

## 2022-09-17 NOTE — Progress Notes (Signed)
SURVIVORSHIP VISIT:  BRIEF ONCOLOGIC HISTORY:  Oncology History Overview Note   Cancer Staging  Malignant neoplasm of lower-outer quadrant of right breast of female, estrogen receptor positive (HCC) Staging form: Breast, AJCC 8th Edition - Clinical stage from 03/18/2022: Stage IA (cT1b, cN0, cM0, G1, ER+, PR+, HER2-) - Signed by Malachy Mood, MD on 03/26/2022 Stage prefix: Initial diagnosis Histologic grading system: 3 grade system     Malignant neoplasm of lower-outer quadrant of right breast of female, estrogen receptor positive (HCC)  03/18/2022 Cancer Staging   Staging form: Breast, AJCC 8th Edition - Clinical stage from 03/18/2022: Stage IA (cT1b, cN0, cM0, G1, ER+, PR+, HER2-) - Signed by Malachy Mood, MD on 03/26/2022 Stage prefix: Initial diagnosis Histologic grading system: 3 grade system    Genetic Testing   [facility] Custom Cancer Panel+RNA was Negative. Report date is 04/01/2022.   The Custom Hereditary Cancers Panel offered by [facility] includes sequencing and/or deletion duplication testing of the following 45 genes: APC, ATM, AXIN2, BAP1, BARD1, BMPR1A, BRCA1, BRCA2, BRIP1, CDH1, CDK4, CDKN2A (p14ARF and p16INK4a only), CHEK2, CTNNA1, EPCAM (Deletion/duplication testing only), FH, GREM1 (promoter region duplication testing only), HOXB13, KIT, MBD4, MEN1, MITF, MLH1, MSH2, MSH3, MSH6, MUTYH, NF1, NHTL1, PALB2, PDGFRA, PMS2, POLD1, POLE, POT1, PTEN, RAD51C, RAD51D, SMAD4, SMARCA4. STK11, TP53, TSC1, TSC2, and VHL.   04/30/2022 Cancer Staging   Staging form: Breast, AJCC 8th Edition - Pathologic stage from 04/30/2022: Stage IA (pT1c, pN0, cM0, G1, ER+, PR+, HER2-, Oncotype DX score: 13) - Signed by Malachy Mood, MD on 07/03/2022 Multigene prognostic tests performed: Oncotype DX Recurrence score range: Greater than or equal to 11 Histologic grading system: 3 grade system Residual tumor (R): R0 - None   04/30/2022 Pathology Results    FINAL MICROSCOPIC DIAGNOSIS:  A. BREAST, RIGHT,  LUMPECTOMY: - Invasive ductal carcinoma, 1.4 cm, grade 1 of 3 - Ductal carcinoma in situ:  Present, cribriform type, without necrosis, nuclear grade 2 - Margins: Negative for invasive carcinoma.  Negative for DCIS     - Closest margin, invasive: Inferior and Anterior, 1.0 mm; Superior, 3.0 mm     - Closet margin, DCIS: Superior and Anterior, 2.0 mm; Inferior, 3.0 mm - Lymphovascular invasion (LVI): Not identified - Calcifications: Present - Prognostic markers:  ER positive (95%), PR positive (95%), Her2 negative (0), Ki-67 (1%) - Other: Small intraductal papilloma with usual ductal hyperplasia (UDH), fibrocystic changes including apocrine metaplasia, usual ductal hyperplasia (UDH) with associated microcalcifications, adenosis and biopsy site changes. - See oncology table and comment  INVASIVE CARCINOMA OF THE BREAST:  Resection  Procedure: Lumpectomy Specimen Laterality: Right Histologic Type: Ductal carcinoma Histologic Grade:      Glandular (Acinar)/Tubular Differentiation: 2      Nuclear Pleomorphism: 2      Mitotic Rate: 1      Overall Grade: 1 Tumor Size: 1.4 cm Ductal Carcinoma In Situ: Present Lymphatic and/or Vascular Invasion: Not identified Treatment Effect in the Breast: No known presurgical therapy Margins: All margins negative for invasive carcinoma      Distance from Closest Margin (mm): 1.0 mm      Specify Closest Margin (required only if <11mm): Inferior and Anterior, 1.0 mm; Superior, 3.0 mm DCIS Margins: Uninvolved by DCIS      Distance from Closest Margin (mm): 2.0 mm      Specify Closest Margin (required only if <24mm): Superior and Anterior, 2.0 mm; Inferior, 3.0 mm Regional Lymph Nodes: Not applicable (no lymph nodes submitted or found)  Distant Metastasis: N/A      Distant Site(s) Involved: N/A Breast Biomarker Testing Performed on Previous Biopsy:      Testing Performed on Case Number: [id number]            Estrogen Receptor: 95%, positive,  strong staining intensity            Progesterone Receptor: 95%, positive, strong staining intensity            HER2: Negative (0)            Ki-67: 1% Pathologic Stage Classification (pTNM, AJCC 8th Edition): pT1c, pNx Representative Tumor Block: A2 Comment(s): Immunohistochemical staining for CK5/6 and ER is performed on blocks A2 and A4.  The staining patterns are consistent with the above diagnosis.  Immunohistochemical staining for CK5/6 and ER is performed on block A8 and the results are noncontributory. (v4.5.0.0)    04/30/2022 Oncotype testing   13/4%, <1% absolute chemo benefit   06/10/2022 - 07/08/2022 Radiation Therapy   Right breast - 40.05 Gy delivered in 15 Fx at 2.67 Gy/Fx Right breast boost - 12 Gy delivered in 6 Fx at 2 Gy/Fx   07/03/2022 -  Anti-estrogen oral therapy   Exemestane (Aromasin)     INTERVAL HISTORY:  Ms. Gogue to review her survivorship care plan detailing her treatment course for breast cancer, as well as monitoring long-term side effects of that treatment, education regarding health maintenance, screening, and overall wellness and health promotion.     Overall, Ms. Pellicane reports feeling quite well.  She is taking Exemestane and feels more sweaty, but otherwise no issues.  She is happy she is tolerating it so well.  She screened positive for snoring.  She has sinus issues and notes that may be the culprit.  She feels rested in the morning when she wakes up and denies any excessive daytime sleepiness or fatigue.    REVIEW OF SYSTEMS:  Review of Systems  Constitutional:  Negative for appetite change, chills, fatigue, fever and unexpected weight change.  HENT:   Negative for hearing loss, lump/mass and trouble swallowing.   Eyes:  Negative for eye problems and icterus.  Respiratory:  Negative for chest tightness, cough and shortness of breath.   Cardiovascular:  Negative for chest pain, leg swelling and palpitations.  Gastrointestinal:  Negative for  abdominal distention, abdominal pain, constipation, diarrhea, nausea and vomiting.  Endocrine: Negative for hot flashes.  Genitourinary:  Negative for difficulty urinating.   Musculoskeletal:  Negative for arthralgias.  Skin:  Negative for itching and rash.  Neurological:  Negative for dizziness, extremity weakness, headaches and numbness.  Hematological:  Negative for adenopathy. Does not bruise/bleed easily.  Psychiatric/Behavioral:  Negative for depression. The patient is not nervous/anxious.   Breast: Denies any new nodularity, masses, tenderness, nipple changes, or nipple discharge.       PAST MEDICAL/SURGICAL HISTORY:  Past Medical History:  Diagnosis Date   Anemia    Hypertension    Past Surgical History:  Procedure Laterality Date   ABDOMINAL HYSTERECTOMY     BREAST BIOPSY Right 03/18/2022   [place] RT BREAST BX W LOC DEV 1ST LESION IMG BX SPEC US GUIDE 03/18/2022 [facility]   BREAST BIOPSY  04/29/2022   MM RT RADIOACTIVE SEED LOC MAMMO GUIDE 04/29/2022 [facility]   BREAST LUMPECTOMY WITH RADIOACTIVE SEED LOCALIZATION Right 04/30/2022   Procedure: RIGHT BREAST LUMPECTOMY WITH RADIOACTIVE SEED LOCALIZATION;  Surgeon: Griselda Miner, MD;  Location: [facility] OR;  Service: General;  Laterality: Right;  CESAREAN SECTION     CHOLECYSTECTOMY     EYE SURGERY     bilateral cataracts   PARTIAL HYSTERECTOMY       ALLERGIES:  Allergies  Allergen Reactions   Erythromycin Nausea And Vomiting    Upset stomach   No Healthtouch Food Allergies Other (See Comments)    Peanuts-mouth sores/rash   Codeine Nausea And Vomiting     CURRENT MEDICATIONS:  Outpatient Encounter Medications as of 09/17/2022  Medication Sig   exemestane (AROMASIN) 25 MG tablet Take 1 tablet (25 mg total) by mouth daily after breakfast.   hydrochlorothiazide (HYDRODIURIL) 25 MG tablet Take 25 mg by mouth in the morning.   metoprolol succinate (TOPROL-XL) 25 MG 24 hr tablet Take 25 mg by mouth in the  morning.   Multiple Vitamin (MULTIVITAMIN WITH MINERALS) TABS tablet Take 1 tablet by mouth in the morning.   [DISCONTINUED] traMADol (ULTRAM) 50 MG tablet Take 1 tablet (50 mg total) by mouth every 6 (six) hours as needed.   No facility-administered encounter medications on file as of 09/17/2022.     ONCOLOGIC FAMILY HISTORY:  Family History  Problem Relation Age of Onset   Multiple myeloma Brother 76   Colon cancer Paternal Grandmother 30 - 54   Lung cancer Paternal Grandfather    Breast cancer Cousin 32 - 64       maternal first cousin   Cancer Cousin        maternal first cousin, unknown type   Melanoma Daughter 43       dx. again at age 72   Cervical cancer Daughter 64     SOCIAL HISTORY:  Social History   Socioeconomic History   Marital status: Married    Spouse name: Not on file   Number of children: 3   Years of education: Not on file   Highest education level: Not on file  Occupational History   Not on file  Tobacco Use   Smoking status: Former    Current packs/day: 0.00    Average packs/day: 1 pack/day for 20.0 years (20.0 ttl pk-yrs)    Types: Cigarettes    Start date: 16    Quit date: 59    Years since quitting: 34.6   Smokeless tobacco: Never  Substance and Sexual Activity   Alcohol use: Yes    Comment: 4-6 glasses wine /week   Drug use: Never   Sexual activity: Not on file  Other Topics Concern   Not on file  Social History Narrative   Not on file   Social Determinants of Health   Financial Resource Strain: Not on file  Food Insecurity: No Food Insecurity (05/27/2022)   Hunger Vital Sign    Worried About Running Out of Food in the Last Year: Never true    Ran Out of Food in the Last Year: Never true  Transportation Needs: No Transportation Needs (05/27/2022)   PRAPARE - Administrator, Civil Service (Medical): No    Lack of Transportation (Non-Medical): No  Physical Activity: Not on file  Stress: Not on file  Social  Connections: Not on file  Intimate Partner Violence: Not At Risk (05/27/2022)   Humiliation, Afraid, Rape, and Kick questionnaire    Fear of Current or Ex-Partner: No    Emotionally Abused: No    Physically Abused: No    Sexually Abused: No     OBSERVATIONS/OBJECTIVE:  BP (!) 144/59 (BP Location: Left Arm, Patient Position: Sitting)   Pulse 74  Temp (!) 97.5 F (36.4 C) (Tympanic)   Resp 18   Ht 5\' 5"  (1.651 m)   Wt 192 lb 1.6 oz (87.1 kg)   SpO2 98%   BMI 31.97 kg/m  GENERAL: Patient is a well appearing female in no acute distress HEENT:  Sclerae anicteric.  Oropharynx clear and moist. No ulcerations or evidence of oropharyngeal candidiasis. Neck is supple.  NODES:  No cervical, supraclavicular, or axillary lymphadenopathy palpated.  BREAST EXAM: Right breast status postlumpectomy and radiation no sign of local recurrence left breast is benign. LUNGS:  Clear to auscultation bilaterally.  No wheezes or rhonchi. HEART:  Regular rate and rhythm. No murmur appreciated. ABDOMEN:  Soft, nontender.  Positive, normoactive bowel sounds. No organomegaly palpated. MSK:  No focal spinal tenderness to palpation. Full range of motion bilaterally in the upper extremities. EXTREMITIES:  No peripheral edema.   SKIN:  Clear with no obvious rashes or skin changes. No nail dyscrasia. NEURO:  Nonfocal. Well oriented.  Appropriate affect.   LABORATORY DATA:  None for this visit.  DIAGNOSTIC IMAGING:  None for this visit.      ASSESSMENT AND PLAN:  Kaitlyn Reed is a pleasant 72 y.o. female with Stage IA right breast invasive ductal carcinoma, ER+/PR+/HER2-, diagnosed in 02/2022, treated with lumpectomy, adjuvant radiation therapy, and anti-estrogen therapy with Exemestane beginning in 06/2022.  She presents to the Survivorship Clinic for our initial meeting and routine follow-up post-completion of treatment for breast cancer.    1. Stage IA right breast cancer:  Ms. Releford is continuing to  recover from definitive treatment for breast cancer. She will follow-up with her medical oncologist, Dr. Mosetta Putt in 12/2022 with history and physical exam per surveillance protocol.  She will continue her anti-estrogen therapy with Exemestane. Thus far, she is tolerating the Exemestane well, with minimal side effects. Her mammogram is due 02/2023; orders placed today.   Today, a comprehensive survivorship care plan and treatment summary was reviewed with the patient today detailing her breast cancer diagnosis, treatment course, potential late/long-term effects of treatment, appropriate follow-up care with recommendations for the future, and patient education resources.  A copy of this summary, along with a letter will be sent to the patient's primary care provider via mail/fax/In Basket message after today's visit.    2. Bone health:  Given Ms. Barba's age/history of breast cancer and her current treatment regimen including anti-estrogen therapy with Exemestane, she is at risk for bone demineralization.  Her last DEXA scan was 02/01/2021, which demonstrated mild osteopenia with a T-score of -1.1 in the left forearm.  Repeat is recommended to occur in January 2025.She was given education on specific activities to promote bone health.  3. Cancer screening:  Due to Ms. Figuereo's history and her age, she should receive screening for skin cancers, colon cancer, and gynecologic cancers.  She notes that she has had a history of abnormal Pap smears and underwent a hysterectomy but has not undergone Pap smear in several years.  I recommended she call and try and get in with Dr. Leda Quail at [facility] for gynecologic evaluation.  The information and recommendations are listed on the patient's comprehensive care plan/treatment summary and were reviewed in detail with the patient.    4. Health maintenance and wellness promotion: Ms. Yenter was encouraged to consume 5-7 servings of fruits and vegetables per  day. We reviewed the "Nutrition Rainbow" handout.  She was also encouraged to engage in moderate to vigorous exercise for 30 minutes per  day most days of the week.  She was instructed to limit her alcohol consumption and continue to abstain from tobacco use/.     5. Support services/counseling: It is not uncommon for this period of the patient's cancer care trajectory to be one of many emotions and stressors.   She was given information regarding our available services and encouraged to contact me with any questions or for help enrolling in any of our support group/programs.    Follow up instructions:    -Return to cancer center 12/2022 for follow-up with Dr. Mosetta Putt -Mammogram due in 02/2023 -Bone density testing in January 2025. -She is welcome to return back to the Survivorship Clinic at any time; no additional follow-up needed at this time.  -Consider referral back to survivorship as a long-term survivor for continued surveillance  The patient was provided an opportunity to ask questions and all were answered. The patient agreed with the plan and demonstrated an understanding of the instructions.   Total encounter time:45 minutes*in face-to-face visit time, chart review, lab review, care coordination, order entry, and documentation of the encounter time.    Lillard Anes, NP 09/17/22 10:47 AM Medical Oncology and Hematology [facility] [address] [place], [place] [address] Tel. [phone]    Fax. [phone]  *Total Encounter Time as defined by the Centers for Medicare and Medicaid Services includes, in addition to the face-to-face time of a patient visit (documented in the note above) non-face-to-face time: obtaining and reviewing outside history, ordering and reviewing medications, tests or procedures, care coordination (communications with other health care professionals or caregivers) and documentation in the medical record.

## 2022-09-18 ENCOUNTER — Other Ambulatory Visit: Payer: Self-pay

## 2022-09-28 ENCOUNTER — Other Ambulatory Visit: Payer: Self-pay | Admitting: Hematology

## 2022-09-30 ENCOUNTER — Telehealth: Payer: Self-pay

## 2022-09-30 NOTE — Telephone Encounter (Signed)
Notified Patient by voicemail of completion of Cancer Claim Form and need for return of Release of Information form that was emailed to her on 09/19/22. Copy of Release Form placed with Cancer Claim form with request to sign upon pick-up of Cancer Claim form.

## 2022-10-28 DIAGNOSIS — K648 Other hemorrhoids: Secondary | ICD-10-CM | POA: Diagnosis not present

## 2022-10-28 DIAGNOSIS — K644 Residual hemorrhoidal skin tags: Secondary | ICD-10-CM | POA: Diagnosis not present

## 2022-10-28 DIAGNOSIS — D123 Benign neoplasm of transverse colon: Secondary | ICD-10-CM | POA: Diagnosis not present

## 2022-10-28 DIAGNOSIS — Z860100 Personal history of colon polyps, unspecified: Secondary | ICD-10-CM | POA: Diagnosis not present

## 2022-10-28 DIAGNOSIS — D122 Benign neoplasm of ascending colon: Secondary | ICD-10-CM | POA: Diagnosis not present

## 2022-10-28 DIAGNOSIS — Z09 Encounter for follow-up examination after completed treatment for conditions other than malignant neoplasm: Secondary | ICD-10-CM | POA: Diagnosis not present

## 2022-11-18 DIAGNOSIS — C50511 Malignant neoplasm of lower-outer quadrant of right female breast: Secondary | ICD-10-CM | POA: Diagnosis not present

## 2022-11-18 DIAGNOSIS — Z170 Estrogen receptor positive status [ER+]: Secondary | ICD-10-CM | POA: Diagnosis not present

## 2022-11-25 DIAGNOSIS — D225 Melanocytic nevi of trunk: Secondary | ICD-10-CM | POA: Diagnosis not present

## 2022-11-25 DIAGNOSIS — D224 Melanocytic nevi of scalp and neck: Secondary | ICD-10-CM | POA: Diagnosis not present

## 2022-11-25 DIAGNOSIS — L821 Other seborrheic keratosis: Secondary | ICD-10-CM | POA: Diagnosis not present

## 2022-11-25 DIAGNOSIS — D485 Neoplasm of uncertain behavior of skin: Secondary | ICD-10-CM | POA: Diagnosis not present

## 2022-11-25 DIAGNOSIS — D3612 Benign neoplasm of peripheral nerves and autonomic nervous system, upper limb, including shoulder: Secondary | ICD-10-CM | POA: Diagnosis not present

## 2022-11-25 DIAGNOSIS — L812 Freckles: Secondary | ICD-10-CM | POA: Diagnosis not present

## 2022-11-25 DIAGNOSIS — D2371 Other benign neoplasm of skin of right lower limb, including hip: Secondary | ICD-10-CM | POA: Diagnosis not present

## 2022-11-25 DIAGNOSIS — D2372 Other benign neoplasm of skin of left lower limb, including hip: Secondary | ICD-10-CM | POA: Diagnosis not present

## 2022-11-25 DIAGNOSIS — D2261 Melanocytic nevi of right upper limb, including shoulder: Secondary | ICD-10-CM | POA: Diagnosis not present

## 2022-12-23 DIAGNOSIS — Z0000 Encounter for general adult medical examination without abnormal findings: Secondary | ICD-10-CM | POA: Diagnosis not present

## 2022-12-23 DIAGNOSIS — Z1331 Encounter for screening for depression: Secondary | ICD-10-CM | POA: Diagnosis not present

## 2022-12-23 DIAGNOSIS — Z6833 Body mass index (BMI) 33.0-33.9, adult: Secondary | ICD-10-CM | POA: Diagnosis not present

## 2022-12-23 NOTE — Assessment & Plan Note (Signed)
pT1cN0M0, stage IA, er+/pr+/her2-, RS 13 --I discussed her surgical path result in details -the Oncotype Dx result was reviewed with her in details. She has low risk based on the recurrence score, which predicts 10 year distant recurrence after 5 years of tamoxifen 4%. There is no benefit for adjuvant chemo --she completed adjuvant radiation in June 2024 -She is adjuvant exemestane now, and for 5 to 7 years -She will continue annual screening mammogram, self exam, and a routine office visit with lab and exam with [place]. -I encouraged her to have healthy diet and exercise regularly.

## 2022-12-24 ENCOUNTER — Inpatient Hospital Stay (HOSPITAL_BASED_OUTPATIENT_CLINIC_OR_DEPARTMENT_OTHER): Payer: Medicare PPO | Admitting: Hematology

## 2022-12-24 ENCOUNTER — Encounter: Payer: Self-pay | Admitting: Hematology

## 2022-12-24 ENCOUNTER — Inpatient Hospital Stay: Payer: Medicare PPO | Attending: Radiation Oncology

## 2022-12-24 VITALS — BP 144/72 | HR 62 | Temp 97.7°F | Resp 16 | Wt 194.7 lb

## 2022-12-24 DIAGNOSIS — Z170 Estrogen receptor positive status [ER+]: Secondary | ICD-10-CM | POA: Diagnosis not present

## 2022-12-24 DIAGNOSIS — Z79811 Long term (current) use of aromatase inhibitors: Secondary | ICD-10-CM | POA: Insufficient documentation

## 2022-12-24 DIAGNOSIS — C50511 Malignant neoplasm of lower-outer quadrant of right female breast: Secondary | ICD-10-CM | POA: Diagnosis present

## 2022-12-24 DIAGNOSIS — Z923 Personal history of irradiation: Secondary | ICD-10-CM | POA: Insufficient documentation

## 2022-12-24 DIAGNOSIS — M549 Dorsalgia, unspecified: Secondary | ICD-10-CM | POA: Insufficient documentation

## 2022-12-24 LAB — CMP (CANCER CENTER ONLY)
ALT: 22 U/L (ref 0–44)
AST: 28 U/L (ref 15–41)
Albumin: 4.3 g/dL (ref 3.5–5.0)
Alkaline Phosphatase: 67 U/L (ref 38–126)
Anion gap: 7 (ref 5–15)
BUN: 14 mg/dL (ref 8–23)
CO2: 32 mmol/L (ref 22–32)
Calcium: 10.1 mg/dL (ref 8.9–10.3)
Chloride: 103 mmol/L (ref 98–111)
Creatinine: 0.73 mg/dL (ref 0.44–1.00)
GFR, Estimated: >60 mL/min (ref >60)
Glucose, Bld: 100 mg/dL — ABNORMAL HIGH (ref 70–99)
Potassium: 3.8 mmol/L (ref 3.5–5.1)
Sodium: 142 mmol/L (ref 135–145)
Total Bilirubin: 0.5 mg/dL (ref <1.2)
Total Protein: 7.7 g/dL (ref 6.5–8.1)

## 2022-12-24 LAB — CBC WITH DIFFERENTIAL (CANCER CENTER ONLY)
Abs Immature Granulocytes: 0.02 10*3/uL (ref 0.00–0.07)
Basophils Absolute: 0.1 10*3/uL (ref 0.0–0.1)
Basophils Relative: 1 %
Eosinophils Absolute: 0.2 10*3/uL (ref 0.0–0.5)
Eosinophils Relative: 4 %
HCT: 38.4 % (ref 36.0–46.0)
Hemoglobin: 13 g/dL (ref 12.0–15.0)
Immature Granulocytes: 0 %
Lymphocytes Relative: 29 %
Lymphs Abs: 2 10*3/uL (ref 0.7–4.0)
MCH: 30 pg (ref 26.0–34.0)
MCHC: 33.9 g/dL (ref 30.0–36.0)
MCV: 88.7 fL (ref 80.0–100.0)
Monocytes Absolute: 0.5 10*3/uL (ref 0.1–1.0)
Monocytes Relative: 7 %
Neutro Abs: 4.1 10*3/uL (ref 1.7–7.7)
Neutrophils Relative %: 59 %
Platelet Count: 239 10*3/uL (ref 150–400)
RBC: 4.33 MIL/uL (ref 3.87–5.11)
RDW: 13.4 % (ref 11.5–15.5)
WBC Count: 6.9 10*3/uL (ref 4.0–10.5)
nRBC: 0 % (ref 0.0–0.2)

## 2022-12-24 NOTE — Progress Notes (Signed)
[facility]   Telephone:([phone]) [phone] Fax:(336) [phone]   Clinic Follow up Note   Patient Care Team: Sigmund Hazel, MD as PCP - General (Family Medicine) Griselda Miner, MD as Consulting Physician (General Surgery) Malachy Mood, MD as Consulting Physician (Hematology) Antony Blackbird, MD as Consulting Physician (Radiation Oncology)  Date of Service:  12/24/2022  CHIEF COMPLAINT: f/u of breast cancer   CURRENT THERAPY:  Exemestane   Oncology History   Malignant neoplasm of lower-outer quadrant of right breast of female, estrogen receptor positive (HCC) pT1cN0M0, stage IA, er+/pr+/her2-, RS 13 --I discussed her surgical path result in details -the Oncotype Dx result was reviewed with her in details. She has low risk based on the recurrence score, which predicts 10 year distant recurrence after 5 years of tamoxifen 4%. There is no benefit for adjuvant chemo --she completed adjuvant radiation in June 2024 -She is adjuvant exemestane now, and for 5 to 7 years -She will continue annual screening mammogram, self exam, and a routine office visit with lab and exam with [place]. -I encouraged her to have healthy diet and exercise regularly.    Assessment and Plan    Breast Cancer 72 year old on Exemestane for 6 months with mild joint pain, occasional hot flashes, and no debilitating symptoms. Recent labs normal, indicating no significant liver or kidney damage. Discussed Exemestane-related joint pain and importance of staying active. Bone density scan and mammogram scheduled for February and March. - Continue Exemestane - Encourage regular exercise to manage joint pain - Bone density scan in February - Mammogram in March - Follow-up in 6 months with Kaitlyn Reed, 1 year with current provider  Degenerative Disc Disease Chronic lower back pain likely due to degenerative disc disease, worsening throughout the day. Discussed posture correction and regular exercise, including swimming and  aerobic classes. - Encourage posture correction and regular exercise - Consider joining a fitness center for structured exercise  General Health Maintenance Generally active but acknowledges poor dietary habits, especially around holidays. Discussed balanced diet with reduced carbohydrates and increased protein and vegetables. - Encourage balanced diet with reduced carbohydrates and increased protein and vegetables  Plan -Continue exemestane - Follow-up with Kaitlyn Reed in 6 months - Follow-up with me in 1 year.  -Mammogram and bone density scan scheduled for next February and March        SUMMARY OF ONCOLOGIC HISTORY: Oncology History Overview Note   Cancer Staging  Malignant neoplasm of lower-outer quadrant of right breast of female, estrogen receptor positive (HCC) Staging form: Breast, AJCC 8th Edition - Clinical stage from 03/18/2022: Stage IA (cT1b, cN0, cM0, G1, ER+, PR+, HER2-) - Signed by Malachy Mood, MD on 03/26/2022 Stage prefix: Initial diagnosis Histologic grading system: 3 grade system     Malignant neoplasm of lower-outer quadrant of right breast of female, estrogen receptor positive (HCC)  03/18/2022 Cancer Staging   Staging form: Breast, AJCC 8th Edition - Clinical stage from 03/18/2022: Stage IA (cT1b, cN0, cM0, G1, ER+, PR+, HER2-) - Signed by Malachy Mood, MD on 03/26/2022 Stage prefix: Initial diagnosis Histologic grading system: 3 grade system    Genetic Testing   [facility] Custom Cancer Panel+RNA was Negative. Report date is 04/01/2022.   The Custom Hereditary Cancers Panel offered by [facility] includes sequencing and/or deletion duplication testing of the following 45 genes: APC, ATM, AXIN2, BAP1, BARD1, BMPR1A, BRCA1, BRCA2, BRIP1, CDH1, CDK4, CDKN2A (p14ARF and p16INK4a only), CHEK2, CTNNA1, EPCAM (Deletion/duplication testing only), FH, GREM1 (promoter region duplication testing only), HOXB13, KIT, MBD4, MEN1,  MITF, MLH1, MSH2, MSH3, MSH6, MUTYH, NF1, NHTL1, PALB2, PDGFRA,  PMS2, POLD1, POLE, POT1, PTEN, RAD51C, RAD51D, SMAD4, SMARCA4. STK11, TP53, TSC1, TSC2, and VHL.   04/30/2022 Cancer Staging   Staging form: Breast, AJCC 8th Edition - Pathologic stage from 04/30/2022: Stage IA (pT1c, pN0, cM0, G1, ER+, PR+, HER2-, Oncotype DX score: 13) - Signed by Malachy Mood, MD on 07/03/2022 Multigene prognostic tests performed: Oncotype DX Recurrence score range: Greater than or equal to 11 Histologic grading system: 3 grade system Residual tumor (R): R0 - None   04/30/2022 Pathology Results    FINAL MICROSCOPIC DIAGNOSIS:  A. 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  Negative for DCIS     - Closest margin, invasive: Inferior and Anterior, 1.0 mm; Superior, 3.0 mm     - Closet margin, DCIS: Superior and Anterior, 2.0 mm; Inferior, 3.0 mm - Lymphovascular invasion (LVI): Not identified - Calcifications: Present - Prognostic markers:  ER positive (95%), PR positive (95%), Her2 negative (0), Ki-67 (1%) - Other: Small intraductal papilloma with usual ductal hyperplasia (UDH), fibrocystic changes including apocrine metaplasia, usual ductal hyperplasia (UDH) with associated microcalcifications, adenosis and biopsy site changes. - See oncology table and comment  INVASIVE CARCINOMA OF THE BREAST:  Resection  Procedure: Lumpectomy Specimen Laterality: Right Histologic Type: Ductal carcinoma Histologic Grade:      Glandular (Acinar)/Tubular Differentiation: 2      Nuclear Pleomorphism: 2      Mitotic Rate: 1      Overall Grade: 1 Tumor Size: 1.4 cm Ductal Carcinoma In Situ: Present Lymphatic and/or Vascular Invasion: Not identified Treatment Effect in the Breast: No known presurgical therapy Margins: All margins negative for invasive carcinoma      Distance from Closest Margin (mm): 1.0 mm       Specify Closest Margin (required only if <17mm): Inferior and Anterior, 1.0 mm; Superior, 3.0 mm DCIS Margins: Uninvolved by DCIS      Distance from Closest Margin (mm): 2.0 mm      Specify Closest Margin (required only if <69mm): Superior and Anterior, 2.0 mm; Inferior, 3.0 mm Regional Lymph Nodes: Not applicable (no lymph nodes submitted or found)      Distant Metastasis: N/A      Distant Site(s) Involved: N/A Breast Biomarker Testing Performed on Previous Biopsy:      Testing Performed on Case Number: [id number]            Estrogen Receptor: 95%, positive, strong staining intensity            Progesterone Receptor: 95%, positive, strong staining intensity            HER2: Negative (0)            Ki-67: 1% Pathologic Stage Classification (pTNM, AJCC 8th Edition): pT1c, pNx Representative Tumor Block: A2 Comment(s): Immunohistochemical staining for CK5/6 and ER is performed on blocks A2 and A4.  The staining patterns are consistent with the above diagnosis.  Immunohistochemical staining for CK5/6 and ER is performed on block A8 and the results are noncontributory. (v4.5.0.0)    04/30/2022 Oncotype testing   13/4%, <1% absolute chemo benefit   06/10/2022 - 07/08/2022 Radiation Therapy   Right breast - 40.05 Gy delivered in 15 Fx at 2.67 Gy/Fx Right breast boost - 12 Gy delivered in 6 Fx at 2 Gy/Fx   07/03/2022 -  Anti-estrogen oral therapy   Exemestane (Aromasin)      Discussed the use of AI scribe software for clinical note transcription with the patient, who gave verbal consent to proceed.  History of Present Illness   The patient, a 72 year old female with a history of breast cancer, presents for a follow-up visit. She reports overall satisfaction with her health status and considers herself "very lucky." She has been taking Exemestane since July and initially experienced some achiness, which she attributes to her age, weight, and weather conditions. She reports occasional  back pain, which she believes may have worsened slightly since starting the medication. However, she does not consider the pain debilitating and manages it without additional interventions. She also reports occasional eye flashes but no significant changes. She maintains an active lifestyle but struggles with maintaining a balanced diet, particularly with limiting carbohydrate intake. She has a scheduled bone density scan and mammogram in the coming months.         All other systems were reviewed with the patient and are negative.  MEDICAL HISTORY:  Past Medical History:  Diagnosis Date   Anemia    Hypertension     SURGICAL HISTORY: Past Surgical History:  Procedure Laterality Date   ABDOMINAL HYSTERECTOMY     BREAST BIOPSY Right 03/18/2022   [place] RT BREAST BX W LOC DEV 1ST LESION IMG BX SPEC US GUIDE 03/18/2022 [facility]   BREAST BIOPSY  04/29/2022   MM RT RADIOACTIVE SEED LOC MAMMO GUIDE 04/29/2022 [facility]   BREAST LUMPECTOMY WITH RADIOACTIVE SEED LOCALIZATION Right 04/30/2022   Procedure: RIGHT BREAST LUMPECTOMY WITH RADIOACTIVE SEED LOCALIZATION;  Surgeon: Griselda Miner, MD;  Location: [facility] OR;  Service: General;  Laterality: Right;   CESAREAN SECTION     CHOLECYSTECTOMY     EYE SURGERY     bilateral cataracts   PARTIAL HYSTERECTOMY      I have reviewed the social history and family history with the patient and they are unchanged from previous note.  ALLERGIES:  is allergic to erythromycin, no healthtouch food allergies, and codeine.  MEDICATIONS:  Current Outpatient Medications  Medication Sig Dispense Refill   exemestane (AROMASIN) 25 MG tablet TAKE 1 TABLET (25 MG TOTAL) BY MOUTH DAILY AFTER BREAKFAST. 90 tablet 1   hydrochlorothiazide (HYDRODIURIL) 25 MG tablet Take 25 mg by mouth in the morning.     metoprolol succinate (TOPROL-XL) 25 MG 24 hr tablet Take 25 mg by mouth in the morning.     Multiple Vitamin (MULTIVITAMIN WITH MINERALS) TABS tablet Take 1  tablet by mouth in the morning.     No current facility-administered medications for this visit.    PHYSICAL EXAMINATION: ECOG PERFORMANCE STATUS: 0 - Asymptomatic  Vitals:   12/24/22 1106  BP: (!) 144/72  Pulse: 62  Resp: 16  Temp: 97.7 F (36.5 C)  SpO2: 97%   Wt Readings from Last 3 Encounters:  12/24/22 194 lb 11.2 oz (88.3 kg)  09/17/22 192 lb 1.6 oz (87.1 kg)  08/14/22 193 lb 3.2 oz (87.6 kg)     GENERAL:alert, no distress and comfortable SKIN: skin color, texture, turgor are normal, no rashes or significant lesions EYES: normal, Conjunctiva are pink and non-injected, sclera clear NECK: supple, thyroid normal size, non-tender, without nodularity LYMPH:  no palpable lymphadenopathy in the cervical, axillary  LUNGS: clear to auscultation and percussion with normal breathing effort HEART: regular rate & rhythm and no murmurs and no lower extremity edema ABDOMEN:abdomen soft, non-tender and normal bowel  sounds Musculoskeletal:no cyanosis of digits and no clubbing  NEURO: alert & oriented x 3 with fluent speech, no focal motor/sensory deficits BREAST: Right breast is darker compared to the left. Incisions healed well, with one described as 'so small I can't even find it'.  No palpable mass or adenopathy.     LABORATORY DATA:  I have reviewed the data as listed    Latest Ref Rng & Units 12/24/2022   10:15 AM 04/23/2022    8:48 AM 03/28/2022    9:57 AM  CBC  WBC 4.0 - 10.5 K/uL 6.9  7.7  7.1   Hemoglobin 12.0 - 15.0 g/dL 40.9  81.1  91.4   Hematocrit 36.0 - 46.0 % 38.4  39.0  37.0   Platelets 150 - 400 K/uL 239  253  239         Latest Ref Rng & Units 12/24/2022   10:15 AM 04/23/2022    8:48 AM 03/28/2022    9:57 AM  CMP  Glucose 70 - 99 mg/dL 782  956  213   BUN 8 - 23 mg/dL 14  14  17    Creatinine 0.44 - 1.00 mg/dL 0.86  5.78  4.69   Sodium 135 - 145 mmol/L 142  138  140   Potassium 3.5 - 5.1 mmol/L 3.8  3.9  3.8   Chloride 98 - 111 mmol/L 103  103  104    CO2 22 - 32 mmol/L 32  27  29   Calcium 8.9 - 10.3 mg/dL 62.9  9.3  9.9   Total Protein 6.5 - 8.1 g/dL 7.7   7.8   Total Bilirubin <1.2 mg/dL 0.5   0.5   Alkaline Phos 38 - 126 U/L 67   64   AST 15 - 41 U/L 28   26   ALT 0 - 44 U/L 22   19       RADIOGRAPHIC STUDIES: I have personally reviewed the radiological images as listed and agreed with the findings in the report. No results found.    No orders of the defined types were placed in this encounter.  All questions were answered. The patient knows to call the clinic with any problems, questions or concerns. No barriers to learning was detected. The total time spent in the appointment was 20 minutes.     Malachy Mood, MD 12/24/2022

## 2022-12-29 DIAGNOSIS — M8580 Other specified disorders of bone density and structure, unspecified site: Secondary | ICD-10-CM | POA: Diagnosis not present

## 2022-12-29 DIAGNOSIS — Z6833 Body mass index (BMI) 33.0-33.9, adult: Secondary | ICD-10-CM | POA: Diagnosis not present

## 2022-12-29 DIAGNOSIS — I10 Essential (primary) hypertension: Secondary | ICD-10-CM | POA: Diagnosis not present

## 2022-12-29 DIAGNOSIS — E7800 Pure hypercholesterolemia, unspecified: Secondary | ICD-10-CM | POA: Diagnosis not present

## 2022-12-29 DIAGNOSIS — E6609 Other obesity due to excess calories: Secondary | ICD-10-CM | POA: Diagnosis not present

## 2022-12-29 DIAGNOSIS — C50511 Malignant neoplasm of lower-outer quadrant of right female breast: Secondary | ICD-10-CM | POA: Diagnosis not present

## 2023-01-26 DIAGNOSIS — Z23 Encounter for immunization: Secondary | ICD-10-CM | POA: Diagnosis not present

## 2023-02-04 DIAGNOSIS — H40013 Open angle with borderline findings, low risk, bilateral: Secondary | ICD-10-CM | POA: Diagnosis not present

## 2023-02-22 DIAGNOSIS — R509 Fever, unspecified: Secondary | ICD-10-CM | POA: Diagnosis not present

## 2023-02-22 DIAGNOSIS — J029 Acute pharyngitis, unspecified: Secondary | ICD-10-CM | POA: Diagnosis not present

## 2023-03-10 ENCOUNTER — Ambulatory Visit
Admission: RE | Admit: 2023-03-10 | Discharge: 2023-03-10 | Disposition: A | Discharge status: Home / Self Care | Payer: Medicare PPO | Source: Ambulatory Visit | Attending: Adult Health | Admitting: Adult Health

## 2023-03-10 ENCOUNTER — Other Ambulatory Visit: Payer: Self-pay | Admitting: Adult Health

## 2023-03-10 DIAGNOSIS — C50511 Malignant neoplasm of lower-outer quadrant of right female breast: Secondary | ICD-10-CM

## 2023-03-10 DIAGNOSIS — N6459 Other signs and symptoms in breast: Secondary | ICD-10-CM | POA: Diagnosis not present

## 2023-03-10 DIAGNOSIS — Z853 Personal history of malignant neoplasm of breast: Secondary | ICD-10-CM | POA: Diagnosis not present

## 2023-03-10 DIAGNOSIS — N644 Mastodynia: Secondary | ICD-10-CM | POA: Diagnosis not present

## 2023-03-24 ENCOUNTER — Other Ambulatory Visit: Payer: Self-pay | Admitting: Hematology

## 2023-04-03 ENCOUNTER — Ambulatory Visit
Admission: RE | Admit: 2023-04-03 | Discharge: 2023-04-03 | Disposition: A | Discharge status: Home / Self Care | Payer: Medicare PPO | Source: Ambulatory Visit | Attending: Adult Health | Admitting: Adult Health

## 2023-04-03 DIAGNOSIS — Z13820 Encounter for screening for osteoporosis: Secondary | ICD-10-CM

## 2023-04-03 DIAGNOSIS — Z79811 Long term (current) use of aromatase inhibitors: Secondary | ICD-10-CM

## 2023-04-03 DIAGNOSIS — E2839 Other primary ovarian failure: Secondary | ICD-10-CM | POA: Diagnosis not present

## 2023-04-03 DIAGNOSIS — N958 Other specified menopausal and perimenopausal disorders: Secondary | ICD-10-CM | POA: Diagnosis not present

## 2023-04-06 ENCOUNTER — Telehealth: Payer: Self-pay | Admitting: *Deleted

## 2023-04-06 DIAGNOSIS — H00021 Hordeolum internum right upper eyelid: Secondary | ICD-10-CM | POA: Diagnosis not present

## 2023-04-06 NOTE — Telephone Encounter (Signed)
 Per Lillard Anes, NP, called and left pt message with information below. Advised to call office if there are any concerns.

## 2023-04-06 NOTE — Telephone Encounter (Signed)
-----   Message from Noreene Filbert sent at 04/03/2023  5:03 PM EDT ----- Bone density normal.  This is good news.  Please let patient know. ----- Message ----- From: Interface, Rad Results In Sent: 04/03/2023   4:43 PM EDT To: Loa Socks, NP

## 2023-04-09 DIAGNOSIS — H00021 Hordeolum internum right upper eyelid: Secondary | ICD-10-CM | POA: Diagnosis not present

## 2023-04-09 DIAGNOSIS — L723 Sebaceous cyst: Secondary | ICD-10-CM | POA: Diagnosis not present

## 2023-04-22 DIAGNOSIS — H00021 Hordeolum internum right upper eyelid: Secondary | ICD-10-CM | POA: Diagnosis not present

## 2023-05-04 ENCOUNTER — Other Ambulatory Visit: Payer: Self-pay

## 2023-05-04 ENCOUNTER — Telehealth: Payer: Self-pay | Admitting: Hematology

## 2023-05-04 NOTE — Telephone Encounter (Signed)
 Scheduled appointments per the patient and nurse request. The patient is aware of the appointment details.

## 2023-05-09 NOTE — Assessment & Plan Note (Signed)
 pT1cN0M0, stage IA, er+/pr+/her2-, RS 13 -Diagnosed in 03/2022, s/p lempectomy 04/30/2022 -the Oncotype Dx result was reviewed with her in details. She has low risk based on the recurrence score, which predicts 10 year distant recurrence after 5 years of tamoxifen 4%. There is no benefit for adjuvant chemo --she completed adjuvant radiation in June 2024 -She is adjuvant exemestane  now, and for 5 to 7 years -She will continue annual screening mammogram, self exam, and a routine office visit with lab and exam with us . -I encouraged her to have healthy diet and exercise regularly.

## 2023-05-11 ENCOUNTER — Inpatient Hospital Stay: Attending: Radiation Oncology

## 2023-05-11 ENCOUNTER — Other Ambulatory Visit: Payer: Self-pay

## 2023-05-11 ENCOUNTER — Inpatient Hospital Stay: Admitting: Hematology

## 2023-05-11 ENCOUNTER — Encounter: Payer: Self-pay | Admitting: Hematology

## 2023-05-11 VITALS — BP 142/80 | HR 71 | Resp 22 | Wt 196.8 lb

## 2023-05-11 DIAGNOSIS — C50511 Malignant neoplasm of lower-outer quadrant of right female breast: Secondary | ICD-10-CM | POA: Insufficient documentation

## 2023-05-11 DIAGNOSIS — Z923 Personal history of irradiation: Secondary | ICD-10-CM | POA: Insufficient documentation

## 2023-05-11 DIAGNOSIS — Z170 Estrogen receptor positive status [ER+]: Secondary | ICD-10-CM

## 2023-05-11 DIAGNOSIS — Z1721 Progesterone receptor positive status: Secondary | ICD-10-CM | POA: Insufficient documentation

## 2023-05-11 DIAGNOSIS — G629 Polyneuropathy, unspecified: Secondary | ICD-10-CM | POA: Insufficient documentation

## 2023-05-11 DIAGNOSIS — Z79811 Long term (current) use of aromatase inhibitors: Secondary | ICD-10-CM | POA: Diagnosis not present

## 2023-05-11 DIAGNOSIS — Z1732 Human epidermal growth factor receptor 2 negative status: Secondary | ICD-10-CM | POA: Diagnosis not present

## 2023-05-11 LAB — CMP (CANCER CENTER ONLY)
ALT: 22 U/L (ref 0–44)
AST: 28 U/L (ref 15–41)
Albumin: 4.3 g/dL (ref 3.5–5.0)
Alkaline Phosphatase: 58 U/L (ref 38–126)
Anion gap: 7 (ref 5–15)
BUN: 16 mg/dL (ref 8–23)
CO2: 32 mmol/L (ref 22–32)
Calcium: 9.8 mg/dL (ref 8.9–10.3)
Chloride: 103 mmol/L (ref 98–111)
Creatinine: 0.77 mg/dL (ref 0.44–1.00)
GFR, Estimated: >60 mL/min (ref >60)
Glucose, Bld: 106 mg/dL — ABNORMAL HIGH (ref 70–99)
Potassium: 3.8 mmol/L (ref 3.5–5.1)
Sodium: 142 mmol/L (ref 135–145)
Total Bilirubin: 0.5 mg/dL (ref 0.0–1.2)
Total Protein: 7.5 g/dL (ref 6.5–8.1)

## 2023-05-11 LAB — CBC WITH DIFFERENTIAL (CANCER CENTER ONLY)
Abs Immature Granulocytes: 0.01 10*3/uL (ref 0.00–0.07)
Basophils Absolute: 0.1 10*3/uL (ref 0.0–0.1)
Basophils Relative: 1 %
Eosinophils Absolute: 0.2 10*3/uL (ref 0.0–0.5)
Eosinophils Relative: 3 %
HCT: 38.7 % (ref 36.0–46.0)
Hemoglobin: 13.6 g/dL (ref 12.0–15.0)
Immature Granulocytes: 0 %
Lymphocytes Relative: 27 %
Lymphs Abs: 2 10*3/uL (ref 0.7–4.0)
MCH: 30.4 pg (ref 26.0–34.0)
MCHC: 35.1 g/dL (ref 30.0–36.0)
MCV: 86.6 fL (ref 80.0–100.0)
Monocytes Absolute: 0.5 10*3/uL (ref 0.1–1.0)
Monocytes Relative: 6 %
Neutro Abs: 4.7 10*3/uL (ref 1.7–7.7)
Neutrophils Relative %: 63 %
Platelet Count: 235 10*3/uL (ref 150–400)
RBC: 4.47 MIL/uL (ref 3.87–5.11)
RDW: 13.2 % (ref 11.5–15.5)
WBC Count: 7.4 10*3/uL (ref 4.0–10.5)
nRBC: 0 % (ref 0.0–0.2)

## 2023-05-11 NOTE — Progress Notes (Signed)
 [facility]   Telephone:([phone] Fax:([phone]) [phone]   Clinic Follow up Note   Patient Care Team: Perley Bradley, MD as PCP - General (Family Medicine) Caralyn Chandler, MD as Consulting Physician (General Surgery) Sonja Allport, MD as Consulting Physician (Hematology) Retta Caster, MD as Consulting Physician (Radiation Oncology)  Date of Service:  05/11/2023  CHIEF COMPLAINT: f/u of breast cancer  CURRENT THERAPY:  Exemestane  was held a week ago  Oncology History   Malignant neoplasm of lower-outer quadrant of right breast of female, estrogen receptor positive (HCC) pT1cN0M0, stage IA, er+/pr+/her2-, RS 13 -Diagnosed in 03/2022, s/p lempectomy 04/30/2022 -the Oncotype Dx result was reviewed with her in details. She has low risk based on the recurrence score, which predicts 10 year distant recurrence after 5 years of tamoxifen 4%. There is no benefit for adjuvant chemo --she completed adjuvant radiation in June 2024 -She is adjuvant exemestane  now, and for 5 to 7 years -She will continue annual screening mammogram, self exam, and a routine office visit with lab and exam with us . -I encouraged her to have healthy diet and exercise regularly.   Assessment & Plan Breast cancer Breast cancer with recent neurological symptoms, including tingling and numbness in hands, arms, and feet, primarily on the left side. Symptoms began several months ago and worsened over time. Improvement noted after discontinuing exemestane  last week. Differential diagnosis includes potential metastasis to the brain or spine, or exemestane  side effects, though the latter is uncommon. No significant breast pain or changes on recent mammogram. Discussed potential cancer recurrence and need for further evaluation to rule out metastasis. - Order circulating tumor DNA test (Guardant360 or Signatera) to detect early recurrence, with approximately 90% sensitivity. - Schedule MRI of the brain and whole spine to  screen for metastasis if symptoms persist or worsen. Discuss sedation option for MRI due to claustrophobia. - Consider switching to tamoxifen after recovery from current side effects, discussing risks including small risk of blood clots. Tamoxifen has a 4% recurrence risk over nine years if taken. - Provide a break from current medication to allow recovery from side effects.  Peripheral neuropathy/paresthesia Peripheral neuropathy with tingling and numbness in hands, arms, and feet, primarily on the left side. Symptoms present for several months, improved since stopping exemestane . Differential diagnosis includes exemestane  side effects or metastasis to the nervous system. - Monitor symptoms for improvement after stopping exemestane . - Proceed with MRI of the brain and whole spine if symptoms persist or worsen.  Fatigue Persistent fatigue impacting daily activities, possibly related to exemestane  use, as fatigue is a known side effect. Reports improvement since stopping exemestane . - Monitor fatigue symptoms and assess improvement after stopping exemestane . - Consider alternative medications if exemestane  is determined to be the cause of fatigue.  Degenerative disc disease Chronic lower back pain, possibly exacerbated by degenerative disc disease, severe enough to disrupt sleep and daily activities. Some improvement noted after stopping exemestane . - Monitor back pain and assess improvement after stopping exemestane . - Consider imaging if pain persists or worsens.  Goals of Care She prioritizes quality of life over aggressive treatment, emphasizing living without pain and enjoying activities like traveling. Aware of potential increased cancer recurrence risk if not on medication but prefers to avoid side effects impacting quality of life. - Respect her wishes to prioritize quality of life in treatment decisions. - Continue to involve her in discussions about treatment options and potential  outcomes.   Plan - Will continue to hold exemestane  - Will  obtain Signatera later this week - Phone visit in the months, to review Signatera results, and discuss next step. - Patient knows to call me if her paresthesia or weakness gets worse, then we will get brain and spine MRI to rule out metastasis.  SUMMARY OF ONCOLOGIC HISTORY: Oncology History Overview Note   Cancer Staging  Malignant neoplasm of lower-outer quadrant of right breast of female, estrogen receptor positive (HCC) Staging form: Breast, AJCC 8th Edition - Clinical stage from 03/18/2022: Stage IA (cT1b, cN0, cM0, G1, ER+, PR+, HER2-) - Signed by Sonja [place], MD on 03/26/2022 Stage prefix: Initial diagnosis Histologic grading system: 3 grade system     Malignant neoplasm of lower-outer quadrant of right breast of female, estrogen receptor positive (HCC)  03/18/2022 Cancer Staging   Staging form: Breast, AJCC 8th Edition - Clinical stage from 03/18/2022: Stage IA (cT1b, cN0, cM0, G1, ER+, PR+, HER2-) - Signed by Sonja Searles, MD on 03/26/2022 Stage prefix: Initial diagnosis Histologic grading system: 3 grade system    Genetic Testing   [facility] Custom Cancer Panel+RNA was Negative. Report date is 04/01/2022.   The Custom Hereditary Cancers Panel offered by [facility] includes sequencing and/or deletion duplication testing of the following 45 genes: APC, ATM, AXIN2, BAP1, BARD1, BMPR1A, BRCA1, BRCA2, BRIP1, CDH1, CDK4, CDKN2A (p14ARF and p16INK4a only), CHEK2, CTNNA1, EPCAM (Deletion/duplication testing only), FH, GREM1 (promoter region duplication testing only), HOXB13, KIT, MBD4, MEN1, MITF, MLH1, MSH2, MSH3, MSH6, MUTYH, NF1, NHTL1, PALB2, PDGFRA, PMS2, POLD1, POLE, POT1, PTEN, RAD51C, RAD51D, SMAD4, SMARCA4. STK11, TP53, TSC1, TSC2, and VHL.   04/30/2022 Cancer Staging   Staging form: Breast, AJCC 8th Edition - Pathologic stage from 04/30/2022: Stage IA (pT1c, pN0, cM0, G1, ER+, PR+, HER2-, Oncotype DX score: 13) - Signed by Sonja ,  MD on 07/03/2022 Multigene prognostic tests performed: Oncotype DX Recurrence score range: Greater than or equal to 11 Histologic grading system: 3 grade system Residual tumor (R): R0 - None   04/30/2022 Pathology Results    FINAL MICROSCOPIC DIAGNOSIS:  A. 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  Negative for DCIS     - Closest margin, invasive: Inferior and Anterior, 1.0 mm; Superior, 3.0 mm     - Closet margin, DCIS: Superior and Anterior, 2.0 mm; Inferior, 3.0 mm - Lymphovascular invasion (LVI): Not identified - Calcifications: Present - Prognostic markers:  ER positive (95%), PR positive (95%), Her2 negative (0), Ki-67 (1%) - Other: Small intraductal papilloma with usual ductal hyperplasia (UDH), fibrocystic changes including apocrine metaplasia, usual ductal hyperplasia (UDH) with associated microcalcifications, adenosis and biopsy site changes. - See oncology table and comment  INVASIVE CARCINOMA OF THE BREAST:  Resection  Procedure: Lumpectomy Specimen Laterality: Right Histologic Type: Ductal carcinoma Histologic Grade:      Glandular (Acinar)/Tubular Differentiation: 2      Nuclear Pleomorphism: 2      Mitotic Rate: 1      Overall Grade: 1 Tumor Size: 1.4 cm Ductal Carcinoma In Situ: Present Lymphatic and/or Vascular Invasion: Not identified Treatment Effect in the Breast: No known presurgical therapy Margins: All margins negative for invasive carcinoma      Distance from Closest Margin (mm): 1.0 mm      Specify Closest Margin (required only if <35mm): Inferior and Anterior, 1.0 mm; Superior, 3.0 mm DCIS Margins: Uninvolved by DCIS  Distance from Closest Margin (mm): 2.0 mm      Specify Closest Margin (required only if <69mm): Superior and Anterior, 2.0 mm; Inferior, 3.0 mm Regional  Lymph Nodes: Not applicable (no lymph nodes submitted or found)      Distant Metastasis: N/A      Distant Site(s) Involved: N/A Breast Biomarker Testing Performed on Previous Biopsy:      Testing Performed on Case Number: [id number]            Estrogen Receptor: 95%, positive, strong staining intensity            Progesterone Receptor: 95%, positive, strong staining intensity            HER2: Negative (0)            Ki-67: 1% Pathologic Stage Classification (pTNM, AJCC 8th Edition): pT1c, pNx Representative Tumor Block: A2 Comment(s): Immunohistochemical staining for CK5/6 and ER is performed on blocks A2 and A4.  The staining patterns are consistent with the above diagnosis.  Immunohistochemical staining for CK5/6 and ER is performed on block A8 and the results are noncontributory. (v4.5.0.0)    04/30/2022 Oncotype testing   13/4%, <1% absolute chemo benefit   06/10/2022 - 07/08/2022 Radiation Therapy   Right breast - 40.05 Gy delivered in 15 Fx at 2.67 Gy/Fx Right breast boost - 12 Gy delivered in 6 Fx at 2 Gy/Fx   07/03/2022 -  Anti-estrogen oral therapy   Exemestane  (Aromasin )      Discussed the use of AI scribe software for clinical note transcription with the patient, who gave verbal consent to proceed.  History of Present Illness A 73 year old patient with a history of breast cancer and degenerative disc disease presents with new symptoms of lower back pain, tingling, and numbness in her hands, arms, and feet. The patient reports that these symptoms have been ongoing for several months and have progressively worsened. The patient also reports fatigue and weight gain. The patient has been on Eczema sting since last July for breast cancer and has recently stopped taking it due to these side effects. The patient has a history of degenerative disc disease, which she thought was the cause of her back pain. However, the severity of the pain and the new symptoms of tingling and  numbness led her to reconsider this. The patient reports no other changes in medication, no fever or chills, and no other significant discomfort.     All other systems were reviewed with the patient and are negative.  MEDICAL HISTORY:  Past Medical History:  Diagnosis Date   Anemia    Hypertension     SURGICAL HISTORY: Past Surgical History:  Procedure Laterality Date   ABDOMINAL HYSTERECTOMY     BREAST BIOPSY Right 03/18/2022   US  RT BREAST BX W LOC DEV 1ST LESION IMG BX SPEC US  GUIDE 03/18/2022 [facility]   BREAST BIOPSY  04/29/2022   MM RT RADIOACTIVE SEED LOC MAMMO GUIDE 04/29/2022 [facility]   BREAST LUMPECTOMY WITH RADIOACTIVE SEED LOCALIZATION Right 04/30/2022   Procedure: RIGHT BREAST LUMPECTOMY WITH RADIOACTIVE SEED LOCALIZATION;  Surgeon: Caralyn Chandler, MD;  Location: [facility] OR;  Service: General;  Laterality: Right;   CESAREAN SECTION     CHOLECYSTECTOMY     EYE SURGERY     bilateral cataracts   PARTIAL HYSTERECTOMY      I have reviewed the social history and family history with the patient and they are unchanged from previous note.  ALLERGIES:  is allergic to  erythromycin, no healthtouch food allergies, and codeine.  MEDICATIONS:  Current Outpatient Medications  Medication Sig Dispense Refill   hydrochlorothiazide (HYDRODIURIL) 25 MG tablet Take 25 mg by mouth in the morning.     metoprolol succinate (TOPROL-XL) 25 MG 24 hr tablet Take 25 mg by mouth in the morning.     Multiple Vitamin (MULTIVITAMIN WITH MINERALS) TABS tablet Take 1 tablet by mouth in the morning.     exemestane  (AROMASIN ) 25 MG tablet TAKE 1 TABLET (25 MG TOTAL) BY MOUTH DAILY AFTER BREAKFAST. (Patient not taking: Reported on 05/11/2023) 90 tablet 1   No current facility-administered medications for this visit.    PHYSICAL EXAMINATION: ECOG PERFORMANCE STATUS: 1 - Symptomatic but completely ambulatory  Vitals:   05/11/23 0841 05/11/23 0842  BP: (!) 168/68 (!) 142/80  Pulse: 71    Resp: (!) 22   SpO2: 94%    Wt Readings from Last 3 Encounters:  05/11/23 196 lb 12.8 oz (89.3 kg)  12/24/22 194 lb 11.2 oz (88.3 kg)  09/17/22 192 lb 1.6 oz (87.1 kg)     GENERAL:alert, no distress and comfortable SKIN: skin color, texture, turgor are normal, no rashes or significant lesions EYES: normal, Conjunctiva are pink and non-injected, sclera clear NECK: supple, thyroid  normal size, non-tender, without nodularity LYMPH:  no palpable lymphadenopathy in the cervical, axillary  LUNGS: clear to auscultation and percussion with normal breathing effort HEART: regular rate & rhythm and no murmurs and no lower extremity edema ABDOMEN:abdomen soft, non-tender and normal bowel sounds Musculoskeletal:no cyanosis of digits and no clubbing  NEURO: alert & oriented x 3 with fluent speech, no focal motor/sensory deficits except mild decreased vibration sensation on bilateral hands Breasts: Breast inspection showed them to be symmetrical with no nipple discharge. Palpation of the breasts and axilla revealed no obvious mass that I could appreciate.  Physical Exam   LABORATORY DATA:  I have reviewed the data as listed    Latest Ref Rng & Units 05/11/2023    8:22 AM 12/24/2022   10:15 AM 04/23/2022    8:48 AM  CBC  WBC 4.0 - 10.5 K/uL 7.4  6.9  7.7   Hemoglobin 12.0 - 15.0 g/dL 54.0  98.1  19.1   Hematocrit 36.0 - 46.0 % 38.7  38.4  39.0   Platelets 150 - 400 K/uL 235  239  253         Latest Ref Rng & Units 05/11/2023    8:22 AM 12/24/2022   10:15 AM 04/23/2022    8:48 AM  CMP  Glucose 70 - 99 mg/dL 478  295  621   BUN 8 - 23 mg/dL 16  14  14    Creatinine 0.44 - 1.00 mg/dL 3.08  6.57  8.46   Sodium 135 - 145 mmol/L 142  142  138   Potassium 3.5 - 5.1 mmol/L 3.8  3.8  3.9   Chloride 98 - 111 mmol/L 103  103  103   CO2 22 - 32 mmol/L 32  32  27   Calcium 8.9 - 10.3 mg/dL 9.8  96.2  9.3   Total Protein 6.5 - 8.1 g/dL 7.5  7.7    Total Bilirubin 0.0 - 1.2 mg/dL 0.5  0.5     Alkaline Phos 38 - 126 U/L 58  67    AST 15 - 41 U/L 28  28    ALT 0 - 44 U/L 22  22        RADIOGRAPHIC STUDIES:  I have personally reviewed the radiological images as listed and agreed with the findings in the report. No results found.    No orders of the defined types were placed in this encounter.  All questions were answered. The patient knows to call the clinic with any problems, questions or concerns. No barriers to learning was detected. The total time spent in the appointment was 40 minutes.     Sonja , MD 05/11/2023

## 2023-05-12 ENCOUNTER — Other Ambulatory Visit: Payer: Self-pay

## 2023-05-12 DIAGNOSIS — Z170 Estrogen receptor positive status [ER+]: Secondary | ICD-10-CM

## 2023-05-12 NOTE — Progress Notes (Signed)
 As per Dr. Maryalice Smaller placed order for Signatera in portal successfully, placed kit in lab to be drawn on 05/15/2023. Received confirmation of receipt.

## 2023-05-15 ENCOUNTER — Inpatient Hospital Stay: Attending: Radiation Oncology

## 2023-05-15 DIAGNOSIS — C50511 Malignant neoplasm of lower-outer quadrant of right female breast: Secondary | ICD-10-CM | POA: Insufficient documentation

## 2023-05-15 DIAGNOSIS — Z170 Estrogen receptor positive status [ER+]: Secondary | ICD-10-CM | POA: Insufficient documentation

## 2023-05-15 DIAGNOSIS — Z1732 Human epidermal growth factor receptor 2 negative status: Secondary | ICD-10-CM | POA: Insufficient documentation

## 2023-05-15 DIAGNOSIS — Z923 Personal history of irradiation: Secondary | ICD-10-CM | POA: Insufficient documentation

## 2023-05-15 DIAGNOSIS — Z79811 Long term (current) use of aromatase inhibitors: Secondary | ICD-10-CM | POA: Insufficient documentation

## 2023-05-15 DIAGNOSIS — I10 Essential (primary) hypertension: Secondary | ICD-10-CM | POA: Insufficient documentation

## 2023-05-15 DIAGNOSIS — G629 Polyneuropathy, unspecified: Secondary | ICD-10-CM | POA: Insufficient documentation

## 2023-05-15 DIAGNOSIS — Z1721 Progesterone receptor positive status: Secondary | ICD-10-CM | POA: Insufficient documentation

## 2023-05-15 DIAGNOSIS — Z87891 Personal history of nicotine dependence: Secondary | ICD-10-CM | POA: Insufficient documentation

## 2023-05-15 LAB — GENETIC SCREENING ORDER

## 2023-05-20 DIAGNOSIS — Z170 Estrogen receptor positive status [ER+]: Secondary | ICD-10-CM | POA: Diagnosis not present

## 2023-05-20 DIAGNOSIS — C50511 Malignant neoplasm of lower-outer quadrant of right female breast: Secondary | ICD-10-CM | POA: Diagnosis not present

## 2023-05-28 ENCOUNTER — Encounter: Payer: Self-pay | Admitting: Hematology

## 2023-06-02 ENCOUNTER — Ambulatory Visit: Payer: Self-pay | Admitting: Hematology

## 2023-06-11 ENCOUNTER — Inpatient Hospital Stay (HOSPITAL_BASED_OUTPATIENT_CLINIC_OR_DEPARTMENT_OTHER): Admitting: Hematology

## 2023-06-11 DIAGNOSIS — G629 Polyneuropathy, unspecified: Secondary | ICD-10-CM | POA: Diagnosis not present

## 2023-06-11 DIAGNOSIS — Z87891 Personal history of nicotine dependence: Secondary | ICD-10-CM | POA: Diagnosis not present

## 2023-06-11 DIAGNOSIS — C50511 Malignant neoplasm of lower-outer quadrant of right female breast: Secondary | ICD-10-CM | POA: Diagnosis not present

## 2023-06-11 DIAGNOSIS — I10 Essential (primary) hypertension: Secondary | ICD-10-CM | POA: Diagnosis not present

## 2023-06-11 DIAGNOSIS — Z1732 Human epidermal growth factor receptor 2 negative status: Secondary | ICD-10-CM | POA: Diagnosis not present

## 2023-06-11 DIAGNOSIS — Z170 Estrogen receptor positive status [ER+]: Secondary | ICD-10-CM | POA: Diagnosis not present

## 2023-06-11 DIAGNOSIS — Z923 Personal history of irradiation: Secondary | ICD-10-CM | POA: Diagnosis not present

## 2023-06-11 DIAGNOSIS — Z79811 Long term (current) use of aromatase inhibitors: Secondary | ICD-10-CM | POA: Diagnosis not present

## 2023-06-11 DIAGNOSIS — Z1721 Progesterone receptor positive status: Secondary | ICD-10-CM | POA: Diagnosis not present

## 2023-06-11 NOTE — Assessment & Plan Note (Addendum)
 pT1cN0M0, stage IA, er+/pr+/her2-, RS 13 -Diagnosed in 03/2022, s/p lempectomy 04/30/2022 -the Oncotype Dx result was reviewed with her in details. She has low risk based on the recurrence score, which predicts 10 year distant recurrence after 5 years of tamoxifen 4%. There is no benefit for adjuvant chemo --she completed adjuvant radiation in June 2024 -She is on adjuvant exemestane  now, and for 5 to 7 years. It was put on hold in April 2025 due to her neuropathy  -She will continue annual screening mammogram, self exam, and a routine office visit with lab and exam with us .

## 2023-06-11 NOTE — Progress Notes (Signed)
 [facility]   [phone] [phone]   Clinic Follow up Note   Patient Care Team: Perley Bradley, MD as PCP - General (Family Medicine) Caralyn Chandler, MD as Consulting Physician (General Surgery) Sonja Arbela, MD as Consulting Physician (Hematology) Retta Caster, MD as Consulting Physician (Radiation Oncology) 06/11/2023  I connected with Ane Keener on 06/11/23 at  8:45 AM EDT by telephone and verified that I am speaking with the correct person using two identifiers.   I discussed the limitations, risks, security and privacy concerns of performing an evaluation and management service by telephone and the availability of in person appointments. I also discussed with the patient that there may be a patient responsible charge related to this service. The patient expressed understanding and agreed to proceed.   Patient's location:  Home  Provider's location:  Office    CHIEF COMPLAINT: Numbness and tingling in arm and legs    CURRENT THERAPY: adjuvant Exemestane  on hold now   Oncology history Malignant neoplasm of lower-outer quadrant of right breast of female, estrogen receptor positive (HCC) pT1cN0M0, stage IA, er+/pr+/her2-, RS 13 -Diagnosed in 03/2022, s/p lempectomy 04/30/2022 -the Oncotype Dx result was reviewed with her in details. She has low risk based on the recurrence score, which predicts 10 year distant recurrence after 5 years of tamoxifen 4%. There is no benefit for adjuvant chemo --she completed adjuvant radiation in June 2024 -She is on adjuvant exemestane  now, and for 5 to 7 years. It was put on hold in April 2025 due to her neuropathy  -She will continue annual screening mammogram, self exam, and a routine office visit with lab and exam with us .   Assessment & Plan Numbness and tingling in arms and legs Persistent numbness and tingling in both legs and left arm, with no improvement over the past month. Symptoms are sometimes  worse at night. Differential diagnosis includes possible neurological causes. Awaiting Signatera test results to rule out metastatic disease. If negative, referral to neurology will be pursued. If positive, a whole body scan will be conducted to identify any metastatic lesions. - Await Signatera test results - Refer to neurology for further evaluation if Signatera test is negative - Instruct her to contact neurology next week if no call is received - Hold eczema treatment until further evaluation - Consider alternative treatment such as tamoxifen after neurology evaluation   History of breast cancer -I will continue to hold adjuvant exemestane  for now due to her neuropathy  Hypertension Well-managed with current antihypertensive medication.  Plan - Due to her persistent neuropathy, we will refer her to [facility] neurology for evaluation - Continue to hold exemestane  for now - Signatera will be about 2 weeks, we will call her with results.  If it is positive will obtain scans - Lab and follow-up in 2 months     SUMMARY OF ONCOLOGIC HISTORY: Oncology History Overview Note   Cancer Staging  Malignant neoplasm of lower-outer quadrant of right breast of female, estrogen receptor positive (HCC) Staging form: Breast, AJCC 8th Edition - Clinical stage from 03/18/2022: Stage IA (cT1b, cN0, cM0, G1, ER+, PR+, HER2-) - Signed by Sonja [place], MD on 03/26/2022 Stage prefix: Initial diagnosis Histologic grading system: 3 grade system     Malignant neoplasm of lower-outer quadrant of right breast of female, estrogen receptor positive (HCC)  03/18/2022 Cancer Staging   Staging form: Breast, AJCC 8th Edition - Clinical stage from 03/18/2022: Stage IA (cT1b, cN0, cM0, G1, ER+, PR+, HER2-) -  Signed by Sonja [place], MD on 03/26/2022 Stage prefix: Initial diagnosis Histologic grading system: 3 grade system    Genetic Testing   [facility] Custom Cancer Panel+RNA was Negative. Report date is 04/01/2022.   The  Custom Hereditary Cancers Panel offered by [facility] includes sequencing and/or deletion duplication testing of the following 45 genes: APC, ATM, AXIN2, BAP1, BARD1, BMPR1A, BRCA1, BRCA2, BRIP1, CDH1, CDK4, CDKN2A (p14ARF and p16INK4a only), CHEK2, CTNNA1, EPCAM (Deletion/duplication testing only), FH, GREM1 (promoter region duplication testing only), HOXB13, KIT, MBD4, MEN1, MITF, MLH1, MSH2, MSH3, MSH6, MUTYH, NF1, NHTL1, PALB2, PDGFRA, PMS2, POLD1, POLE, POT1, PTEN, RAD51C, RAD51D, SMAD4, SMARCA4. STK11, TP53, TSC1, TSC2, and VHL.   04/30/2022 Cancer Staging   Staging form: Breast, AJCC 8th Edition - Pathologic stage from 04/30/2022: Stage IA (pT1c, pN0, cM0, G1, ER+, PR+, HER2-, Oncotype DX score: 13) - Signed by Sonja [place], MD on 07/03/2022 Multigene prognostic tests performed: Oncotype DX Recurrence score range: Greater than or equal to 11 Histologic grading system: 3 grade system Residual tumor (R): R0 - None   04/30/2022 Pathology Results    FINAL MICROSCOPIC DIAGNOSIS:  A. 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  Negative for DCIS     - Closest margin, invasive: Inferior and Anterior, 1.0 mm; Superior, 3.0 mm     - Closet margin, DCIS: Superior and Anterior, 2.0 mm; Inferior, 3.0 mm - Lymphovascular invasion (LVI): Not identified - Calcifications: Present - Prognostic markers:  ER positive (95%), PR positive (95%), Her2 negative (0), Ki-67 (1%) - Other: Small intraductal papilloma with usual ductal hyperplasia (UDH), fibrocystic changes including apocrine metaplasia, usual ductal hyperplasia (UDH) with associated microcalcifications, adenosis and biopsy site changes. - See oncology table and comment  INVASIVE CARCINOMA OF THE BREAST:  Resection  Procedure: Lumpectomy Specimen Laterality: Right Histologic Type:  Ductal carcinoma Histologic Grade:      Glandular (Acinar)/Tubular Differentiation: 2      Nuclear Pleomorphism: 2      Mitotic Rate: 1      Overall Grade: 1 Tumor Size: 1.4 cm Ductal Carcinoma In Situ: Present Lymphatic and/or Vascular Invasion: Not identified Treatment Effect in the Breast: No known presurgical therapy Margins: All margins negative for invasive carcinoma      Distance from Closest Margin (mm): 1.0 mm      Specify Closest Margin (required only if <42mm): Inferior and Anterior, 1.0 mm; Superior, 3.0 mm DCIS Margins: Uninvolved by DCIS      Distance from Closest Margin (mm): 2.0 mm      Specify Closest Margin (required only if <31mm): Superior and Anterior, 2.0 mm; Inferior, 3.0 mm Regional Lymph Nodes: Not applicable (no lymph nodes submitted or found)      Distant Metastasis: N/A      Distant Site(s) Involved: N/A Breast Biomarker Testing Performed on Previous Biopsy:      Testing Performed on Case Number: [id number]            Estrogen Receptor: 95%, positive, strong staining intensity            Progesterone Receptor: 95%, positive, strong staining intensity            HER2: Negative (0)            Ki-67: 1% Pathologic Stage Classification (pTNM, AJCC 8th Edition): pT1c, pNx Representative Tumor Block: A2 Comment(s): Immunohistochemical staining for CK5/6  and ER is performed on blocks A2 and A4.  The staining patterns are consistent with the above diagnosis.  Immunohistochemical staining for CK5/6 and ER is performed on block A8 and the results are noncontributory. (v4.5.0.0)    04/30/2022 Oncotype testing   13/4%, <1% absolute chemo benefit   06/10/2022 - 07/08/2022 Radiation Therapy   Right breast - 40.05 Gy delivered in 15 Fx at 2.67 Gy/Fx Right breast boost - 12 Gy delivered in 6 Fx at 2 Gy/Fx   07/03/2022 -  Anti-estrogen oral therapy   Exemestane  (Aromasin )     Discussed the use of AI scribe software for clinical note transcription with the  patient, who gave verbal consent to proceed.  History of Present Illness Kaitlyn Reed is a 73 year old female with breast cancer who presents with numbness and tingling in her arms and legs.  She experiences persistent numbness and tingling in both arms and legs, with the left arm being notably affected. The symptoms fluctuate, sometimes improving slightly but often worsening the next day, and are more pronounced at night. She denies any falls and attempts to alleviate symptoms by moving around. She is currently taking blood pressure medication. A Signatera test was conducted two weeks ago, and results are expected in the next two weeks.     REVIEW OF SYSTEMS:   Constitutional: Denies fevers, chills or abnormal weight loss Eyes: Denies blurriness of vision Ears, nose, mouth, throat, and face: Denies mucositis or sore throat Respiratory: Denies cough, dyspnea or wheezes Cardiovascular: Denies palpitation, chest discomfort or lower extremity swelling Gastrointestinal:  Denies nausea, heartburn or change in bowel habits Skin: Denies abnormal skin rashes Lymphatics: Denies new lymphadenopathy or easy bruising Neurological:Denies numbness, tingling or new weaknesses Behavioral/Psych: Mood is stable, no new changes  All other systems were reviewed with the patient and are negative.  MEDICAL HISTORY:  Past Medical History:  Diagnosis Date   Anemia    Hypertension     SURGICAL HISTORY: Past Surgical History:  Procedure Laterality Date   ABDOMINAL HYSTERECTOMY     BREAST BIOPSY Right 03/18/2022   US  RT BREAST BX W LOC DEV 1ST LESION IMG BX SPEC US  GUIDE 03/18/2022 [facility]   BREAST BIOPSY  04/29/2022   MM RT RADIOACTIVE SEED LOC MAMMO GUIDE 04/29/2022 [facility]MO[facility]   BREAST LUMPECTOMY WITH RADIOACTIVE SEED LOCALIZATION Right 04/30/2022   Procedure: RIGHT BREAST LUMPECTOMY WITH RADIOACTIVE SEED LOCALIZATION;  Surgeon: Caralyn Chandler, MD;  Location: [facility] OR;  Service:  General;  Laterality: Right;   CESAREAN SECTION     CHOLECYSTECTOMY     EYE SURGERY     bilateral cataracts   PARTIAL HYSTERECTOMY      I have reviewed the social history and family history with the patient and they are unchanged from previous note.  ALLERGIES:  is allergic to erythromycin, no healthtouch food allergies, and codeine.  MEDICATIONS:  Current Outpatient Medications  Medication Sig Dispense Refill   exemestane  (AROMASIN ) 25 MG tablet TAKE 1 TABLET (25 MG TOTAL) BY MOUTH DAILY AFTER BREAKFAST. (Patient not taking: Reported on 05/11/2023) 90 tablet 1   hydrochlorothiazide (HYDRODIURIL) 25 MG tablet Take 25 mg by mouth in the morning.     metoprolol succinate (TOPROL-XL) 25 MG 24 hr tablet Take 25 mg by mouth in the morning.     Multiple Vitamin (MULTIVITAMIN WITH MINERALS) TABS tablet Take 1 tablet by mouth in the morning.     No current facility-administered medications for this visit.  PHYSICAL EXAMINATION: Not performed   LABORATORY DATA:  I have reviewed the data as listed    Latest Ref Rng & Units 05/11/2023    8:22 AM 12/24/2022   10:15 AM 04/23/2022    8:48 AM  CBC  WBC 4.0 - 10.5 K/uL 7.4  6.9  7.7   Hemoglobin 12.0 - 15.0 g/dL 16.1  09.6  04.5   Hematocrit 36.0 - 46.0 % 38.7  38.4  39.0   Platelets 150 - 400 K/uL 235  239  253         Latest Ref Rng & Units 05/11/2023    8:22 AM 12/24/2022   10:15 AM 04/23/2022    8:48 AM  CMP  Glucose 70 - 99 mg/dL 409  811  914   BUN 8 - 23 mg/dL 16  14  14    Creatinine 0.44 - 1.00 mg/dL 7.82  9.56  2.13   Sodium 135 - 145 mmol/L 142  142  138   Potassium 3.5 - 5.1 mmol/L 3.8  3.8  3.9   Chloride 98 - 111 mmol/L 103  103  103   CO2 22 - 32 mmol/L 32  32  27   Calcium 8.9 - 10.3 mg/dL 9.8  08.6  9.3   Total Protein 6.5 - 8.1 g/dL 7.5  7.7    Total Bilirubin 0.0 - 1.2 mg/dL 0.5  0.5    Alkaline Phos 38 - 126 U/L 58  67    AST 15 - 41 U/L 28  28    ALT 0 - 44 U/L 22  22        RADIOGRAPHIC STUDIES: I  have personally reviewed the radiological images as listed and agreed with the findings in the report. No results found.     I discussed the assessment and treatment plan with the patient. The patient was provided an opportunity to ask questions and all were answered. The patient agreed with the plan and demonstrated an understanding of the instructions.   The patient was advised to call back or seek an in-person evaluation if the symptoms worsen or if the condition fails to improve as anticipated.  I provided 15 minutes of non face-to-face telephone visit time during this encounter, including review of chart and various tests results, discussions about plan of care and coordination of care plan.    Sonja Hunter, MD 06/11/23

## 2023-06-12 ENCOUNTER — Other Ambulatory Visit: Payer: Self-pay

## 2023-06-16 ENCOUNTER — Encounter: Payer: Medicare PPO | Admitting: Adult Health

## 2023-06-16 ENCOUNTER — Other Ambulatory Visit: Payer: Medicare PPO

## 2023-06-21 LAB — SIGNATERA
SIGNATERA MTM READOUT: 0 MTM/ml
SIGNATERA TEST RESULT: NEGATIVE

## 2023-06-22 ENCOUNTER — Encounter (HOSPITAL_COMMUNITY): Payer: Self-pay

## 2023-06-23 ENCOUNTER — Ambulatory Visit: Payer: Self-pay | Admitting: Nurse Practitioner

## 2023-06-25 NOTE — Telephone Encounter (Addendum)
 Called to relay message below as per Lacie Burton NP, no answer LM on VM, will send message on Mychart.    ----- Message from Rolin Clifton sent at 06/23/2023  8:27 AM EDT ----- Please let pt know signatera is negative/not detected, great news. Please complete neuro referral per Dr. Candise Chambers plan.  Thanks Lacie NP

## 2023-08-10 DIAGNOSIS — H26493 Other secondary cataract, bilateral: Secondary | ICD-10-CM | POA: Diagnosis not present

## 2023-08-10 DIAGNOSIS — H2589 Other age-related cataract: Secondary | ICD-10-CM | POA: Diagnosis not present

## 2023-08-10 DIAGNOSIS — H00021 Hordeolum internum right upper eyelid: Secondary | ICD-10-CM | POA: Diagnosis not present

## 2023-08-10 DIAGNOSIS — H524 Presbyopia: Secondary | ICD-10-CM | POA: Diagnosis not present

## 2023-08-10 DIAGNOSIS — H40013 Open angle with borderline findings, low risk, bilateral: Secondary | ICD-10-CM | POA: Diagnosis not present

## 2023-08-12 ENCOUNTER — Other Ambulatory Visit: Payer: Self-pay

## 2023-08-12 DIAGNOSIS — C50511 Malignant neoplasm of lower-outer quadrant of right female breast: Secondary | ICD-10-CM

## 2023-08-13 ENCOUNTER — Encounter: Payer: Self-pay | Admitting: Hematology

## 2023-08-13 ENCOUNTER — Inpatient Hospital Stay

## 2023-08-13 ENCOUNTER — Inpatient Hospital Stay: Admitting: Hematology

## 2023-08-13 ENCOUNTER — Inpatient Hospital Stay: Attending: Radiation Oncology

## 2023-08-13 VITALS — BP 144/68 | HR 91 | Temp 97.6°F | Resp 19 | Ht 64.0 in | Wt 194.4 lb

## 2023-08-13 DIAGNOSIS — Z1732 Human epidermal growth factor receptor 2 negative status: Secondary | ICD-10-CM | POA: Diagnosis not present

## 2023-08-13 DIAGNOSIS — C50511 Malignant neoplasm of lower-outer quadrant of right female breast: Secondary | ICD-10-CM | POA: Diagnosis not present

## 2023-08-13 DIAGNOSIS — Z79899 Other long term (current) drug therapy: Secondary | ICD-10-CM | POA: Insufficient documentation

## 2023-08-13 DIAGNOSIS — G629 Polyneuropathy, unspecified: Secondary | ICD-10-CM | POA: Diagnosis not present

## 2023-08-13 DIAGNOSIS — Z170 Estrogen receptor positive status [ER+]: Secondary | ICD-10-CM | POA: Diagnosis not present

## 2023-08-13 DIAGNOSIS — Z1721 Progesterone receptor positive status: Secondary | ICD-10-CM | POA: Insufficient documentation

## 2023-08-13 DIAGNOSIS — Z79811 Long term (current) use of aromatase inhibitors: Secondary | ICD-10-CM | POA: Insufficient documentation

## 2023-08-13 DIAGNOSIS — Z923 Personal history of irradiation: Secondary | ICD-10-CM | POA: Diagnosis not present

## 2023-08-13 LAB — CBC WITH DIFFERENTIAL (CANCER CENTER ONLY)
Abs Immature Granulocytes: 0.01 K/uL (ref 0.00–0.07)
Basophils Absolute: 0.1 K/uL (ref 0.0–0.1)
Basophils Relative: 1 %
Eosinophils Absolute: 0.2 K/uL (ref 0.0–0.5)
Eosinophils Relative: 3 %
HCT: 38.3 % (ref 36.0–46.0)
Hemoglobin: 13.1 g/dL (ref 12.0–15.0)
Immature Granulocytes: 0 %
Lymphocytes Relative: 29 %
Lymphs Abs: 1.7 K/uL (ref 0.7–4.0)
MCH: 30.1 pg (ref 26.0–34.0)
MCHC: 34.2 g/dL (ref 30.0–36.0)
MCV: 88 fL (ref 80.0–100.0)
Monocytes Absolute: 0.4 K/uL (ref 0.1–1.0)
Monocytes Relative: 6 %
Neutro Abs: 3.6 K/uL (ref 1.7–7.7)
Neutrophils Relative %: 61 %
Platelet Count: 218 K/uL (ref 150–400)
RBC: 4.35 MIL/uL (ref 3.87–5.11)
RDW: 13.1 % (ref 11.5–15.5)
WBC Count: 6 K/uL (ref 4.0–10.5)
nRBC: 0 % (ref 0.0–0.2)

## 2023-08-13 LAB — CMP (CANCER CENTER ONLY)
ALT: 27 U/L (ref 0–44)
AST: 33 U/L (ref 15–41)
Albumin: 4.2 g/dL (ref 3.5–5.0)
Alkaline Phosphatase: 60 U/L (ref 38–126)
Anion gap: 6 (ref 5–15)
BUN: 15 mg/dL (ref 8–23)
CO2: 33 mmol/L — ABNORMAL HIGH (ref 22–32)
Calcium: 9.5 mg/dL (ref 8.9–10.3)
Chloride: 102 mmol/L (ref 98–111)
Creatinine: 0.72 mg/dL (ref 0.44–1.00)
GFR, Estimated: >60 mL/min (ref >60)
Glucose, Bld: 101 mg/dL — ABNORMAL HIGH (ref 70–99)
Potassium: 4.1 mmol/L (ref 3.5–5.1)
Sodium: 141 mmol/L (ref 135–145)
Total Bilirubin: 0.6 mg/dL (ref 0.0–1.2)
Total Protein: 7.6 g/dL (ref 6.5–8.1)

## 2023-08-13 LAB — VITAMIN B12: Vitamin B-12: 352 pg/mL (ref 180–914)

## 2023-08-13 LAB — T4, FREE: Free T4: 0.8 ng/dL (ref 0.61–1.12)

## 2023-08-13 LAB — TSH: TSH: 1.48 u[IU]/mL (ref 0.350–4.500)

## 2023-08-13 NOTE — Progress Notes (Signed)
 [facility]   [phone] [phone]   Clinic Follow up Note   Patient Care Team: Cleotilde Planas, MD as PCP - General (Family Medicine) Curvin Deward MOULD, MD as Consulting Physician (General Surgery) Lanny Callander, MD as Consulting Physician (Hematology) Shannon Agent, MD as Consulting Physician (Radiation Oncology)  Date of Service:  08/13/2023  CHIEF COMPLAINT: f/u of breast cancer  CURRENT THERAPY:  Cancer surveillance, adjuvant antiestrogen therapy on hold for now    Oncology History   Malignant neoplasm of lower-outer quadrant of right breast of female, estrogen receptor positive (HCC) pT1cN0M0, stage IA, er+/pr+/her2-, RS 13 -Diagnosed in 03/2022, s/p lempectomy 04/30/2022 -the Oncotype Dx result was reviewed with her in details. She has low risk based on the recurrence score, which predicts 10 year distant recurrence after 5 years of tamoxifen 4%. There is no benefit for adjuvant chemo --she completed adjuvant radiation in June 2024 -She is on adjuvant exemestane  now, and for 5 to 7 years. It was put on hold in April 2025 due to her neuropathy  -She will continue annual screening mammogram, self exam, and a routine office visit with lab and exam with us .   Assessment & Plan Breast cancer under surveillance Breast cancer is currently under surveillance with no symptoms suggestive of recurrence. Signatera test was negative. Hormonal therapy options, including anastrozole, letrozole, and tamoxifen, were discussed. Concerns about side effects such as blood clots (4-5% risk) and stroke with tamoxifen were noted. Decision to wait for completion of neuropathy workup before initiating hormonal therapy. - Perform lab tests today, including B12 level and amyloidosis test - Contact Dr. 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 Neuropathy began in April, prior to stopping exemestane , and has not improved since discontinuation. Differential diagnosis includes B12 deficiency and amyloidosis. No evidence of cancer-related neuropathy. Neurology appointment delayed until September, but efforts to expedite are underway. - Perform lab tests today, including B12 level and amyloidosis test - Contact Dr. Callander to expedite neurology appointment - Consider MRI of the brain if insurance approves, with discussion of sedation options for claustrophobia  Plan - Will plan additional lab work for her neuropathy today - Due to severe claustrophobia, will hold brain and spinal MRI for now - I messaged the neurologist Dr. Callander to see if she can see her sooner than scheduled -Continue holding antiestrogen therapy - Phone visit in 3 months.   SUMMARY OF ONCOLOGIC HISTORY: Oncology History Overview Note   Cancer Staging  Malignant neoplasm of lower-outer quadrant of right breast of female, estrogen receptor positive (HCC) Staging form: Breast, AJCC 8th Edition - Clinical stage from 03/18/2022: Stage IA (cT1b, cN0, cM0, G1, ER+, PR+, HER2-) - Signed by Lanny Callander, MD on 03/26/2022 Stage prefix: Initial diagnosis Histologic grading system: 3 grade system     Malignant neoplasm of lower-outer quadrant of right breast of female, estrogen receptor positive (HCC)  03/18/2022 Cancer Staging   Staging form: Breast, AJCC 8th Edition - Clinical stage from 03/18/2022: Stage IA (cT1b, cN0, cM0, G1, ER+, PR+, HER2-) - Signed by Lanny Callander, MD on 03/26/2022 Stage prefix: Initial diagnosis Histologic grading system: 3 grade system    Genetic Testing   [facility] Custom  Cancer Panel+RNA was Negative. Report date is 04/01/2022.   The  Custom Hereditary Cancers Panel offered by [facility] includes sequencing and/or deletion duplication testing of the following 45 genes: APC, ATM, AXIN2, BAP1, BARD1, BMPR1A, BRCA1, BRCA2, BRIP1, CDH1, CDK4, CDKN2A (p14ARF and p16INK4a only), CHEK2, CTNNA1, EPCAM (Deletion/duplication testing only), FH, GREM1 (promoter region duplication testing only), HOXB13, KIT, MBD4, MEN1, MITF, MLH1, MSH2, MSH3, MSH6, MUTYH, NF1, NHTL1, PALB2, PDGFRA, PMS2, POLD1, POLE, POT1, PTEN, RAD51C, RAD51D, SMAD4, SMARCA4. STK11, TP53, TSC1, TSC2, and VHL.   04/30/2022 Cancer Staging   Staging form: Breast, AJCC 8th Edition - Pathologic stage from 04/30/2022: Stage IA (pT1c, pN0, cM0, G1, ER+, PR+, HER2-, Oncotype DX score: 13) - Signed by Lanny Callander, MD on 07/03/2022 Multigene prognostic tests performed: Oncotype DX Recurrence score range: Greater than or equal to 11 Histologic grading system: 3 grade system Residual tumor (R): R0 - None   04/30/2022 Pathology Results    FINAL MICROSCOPIC DIAGNOSIS:  A. 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  Negative for DCIS     - Closest margin, invasive: Inferior and Anterior, 1.0 mm; Superior, 3.0 mm     - Closet margin, DCIS: Superior and Anterior, 2.0 mm; Inferior, 3.0 mm - Lymphovascular invasion (LVI): Not identified - Calcifications: Present - Prognostic markers:  ER positive (95%), PR positive (95%), Her2 negative (0), Ki-67 (1%) - Other: Small intraductal papilloma with usual ductal hyperplasia (UDH), fibrocystic changes including apocrine metaplasia, usual ductal hyperplasia (UDH) with associated microcalcifications, adenosis and biopsy site changes. - See oncology table and comment  INVASIVE CARCINOMA OF THE BREAST:  Resection  Procedure: Lumpectomy Specimen Laterality: Right Histologic Type:  Ductal carcinoma Histologic Grade:      Glandular (Acinar)/Tubular Differentiation: 2      Nuclear Pleomorphism: 2      Mitotic Rate: 1      Overall Grade: 1 Tumor Size: 1.4 cm Ductal Carcinoma In Situ: Present Lymphatic and/or Vascular Invasion: Not identified Treatment Effect in the Breast: No known presurgical therapy Margins: All margins negative for invasive carcinoma      Distance from Closest Margin (mm): 1.0 mm      Specify Closest Margin (required only if <58mm): Inferior and Anterior, 1.0 mm; Superior, 3.0 mm DCIS Margins: Uninvolved by DCIS      Distance from Closest Margin (mm): 2.0 mm      Specify Closest Margin (required only if <18mm): Superior and Anterior, 2.0 mm; Inferior, 3.0 mm Regional Lymph Nodes: Not applicable (no lymph nodes submitted or found)      Distant Metastasis: N/A      Distant Site(s) Involved: N/A Breast Biomarker Testing Performed on Previous Biopsy:      Testing Performed on Case Number: [id number]            Estrogen Receptor: 95%, positive, strong staining intensity            Progesterone Receptor: 95%, positive, strong staining intensity            HER2: Negative (0)            Ki-67: 1% Pathologic Stage Classification (pTNM, AJCC 8th Edition): pT1c, pNx Representative Tumor Block: A2 Comment(s): Immunohistochemical staining for CK5/6 and ER is performed on blocks A2 and A4.  The staining patterns are consistent with the above diagnosis.  Immunohistochemical staining for CK5/6 and ER  is performed on block A8 and the results are noncontributory. (v4.5.0.0)    04/30/2022 Oncotype testing   13/4%, <1% absolute chemo benefit   06/10/2022 - 07/08/2022 Radiation Therapy   Right breast - 40.05 Gy delivered in 15 Fx at 2.67 Gy/Fx Right breast boost - 12 Gy delivered in 6 Fx at 2 Gy/Fx   07/03/2022 -  Anti-estrogen oral therapy   Exemestane  (Aromasin )      Discussed the use of AI scribe software for clinical note transcription with the  patient, who gave verbal consent to proceed.  History of Present Illness Kaitlyn Reed is a 73 year old female with breast cancer who presents for follow-up.  She experiences persistent neuropathy, beginning in her feet and progressing to her legs, arms, hands, and occasionally her face, described as a sensation of 'compression socks' with tingling. Neuropathy started in April 2025, a few weeks before her last visit in May 2025. She initially attributed it to exemestane , which she stopped taking, but symptoms persisted. She has not yet seen a neurologist due to appointment availability issues.  She was on exemestane  for approximately eight months, starting last year, and discontinued it due to persistent symptoms. She underwent a Signatera test, which was negative, but her insurance denied coverage for the test. She is considering financial assistance options.  She feels unusually fatigued and slow, which she noticed during a recent beach trip with her family. Previously active and enjoying walking, she now feels fatigued after short distances, such as from the parking lot to the clinic. No weight loss, but she feels 'tired' and possibly experiences mild shortness of breath, especially in the heat.  She has degenerative disc disease, which causes lower back pain, but reports improvement compared to when she was on exemestane . No significant changes in her back pain, consistent with her history of degenerative disc disease. No swelling in her legs. No headaches or vision changes and recently had a normal eye exam. She experiences numbness and tingling in her extremities, describing it as a sensation of them being 'asleep'. No significant weakness, but she feels clumsy at times.     All other systems were reviewed with the patient and are negative.  MEDICAL HISTORY:  Past Medical History:  Diagnosis Date   Anemia    Hypertension     SURGICAL HISTORY: Past Surgical History:  Procedure  Laterality Date   ABDOMINAL HYSTERECTOMY     BREAST BIOPSY Right 03/18/2022   US  RT BREAST BX W LOC DEV 1ST LESION IMG BX SPEC US  GUIDE 03/18/2022 [facility]   BREAST BIOPSY  04/29/2022   MM RT RADIOACTIVE SEED LOC MAMMO GUIDE 04/29/2022 [facility]   BREAST LUMPECTOMY WITH RADIOACTIVE SEED LOCALIZATION Right 04/30/2022   Procedure: RIGHT BREAST LUMPECTOMY WITH RADIOACTIVE SEED LOCALIZATION;  Surgeon: Curvin Deward MOULD, MD;  Location: [facility] OR;  Service: General;  Laterality: Right;   CESAREAN SECTION     CHOLECYSTECTOMY     EYE SURGERY     bilateral cataracts   PARTIAL HYSTERECTOMY      I have reviewed the social history and family history with the patient and they are unchanged from previous note.  ALLERGIES:  is allergic to erythromycin, no healthtouch food allergies, and codeine.  MEDICATIONS:  Current Outpatient Medications  Medication Sig Dispense Refill   exemestane  (AROMASIN ) 25 MG tablet TAKE 1 TABLET (25 MG TOTAL) BY MOUTH DAILY AFTER BREAKFAST. (Patient not taking: Reported on 05/11/2023) 90 tablet 1   hydrochlorothiazide (HYDRODIURIL) 25 MG  tablet Take 25 mg by mouth in the morning.     metoprolol succinate (TOPROL-XL) 25 MG 24 hr tablet Take 25 mg by mouth in the morning.     Multiple Vitamin (MULTIVITAMIN WITH MINERALS) TABS tablet Take 1 tablet by mouth in the morning.     No current facility-administered medications for this visit.    PHYSICAL EXAMINATION: ECOG PERFORMANCE STATUS: 1 - Symptomatic but completely ambulatory  Vitals:   08/13/23 1118  BP: (!) 144/68  Pulse: 91  Resp: 19  Temp: 97.6 F (36.4 C)  SpO2: 96%   Wt Readings from Last 3 Encounters:  08/13/23 194 lb 6.4 oz (88.2 kg)  05/11/23 196 lb 12.8 oz (89.3 kg)  12/24/22 194 lb 11.2 oz (88.3 kg)     GENERAL:alert, no distress and comfortable SKIN: skin color, texture, turgor are normal, no rashes or significant lesions EYES: normal, Conjunctiva are pink and non-injected, sclera  clear NECK: supple, thyroid  normal size, non-tender, without nodularity LYMPH:  no palpable lymphadenopathy in the cervical, axillary  LUNGS: clear to auscultation and percussion with normal breathing effort HEART: regular rate & rhythm and no murmurs and no lower extremity edema ABDOMEN:abdomen soft, non-tender and normal bowel sounds Musculoskeletal:no cyanosis of digits and no clubbing  NEURO: alert & oriented x 3 with fluent speech, no focal motor/sensory deficits except decreased vibration sensation in extremities.  Physical Exam   LABORATORY DATA:  I have reviewed the data as listed    Latest Ref Rng & Units 08/13/2023   10:42 AM 05/11/2023    8:22 AM 12/24/2022   10:15 AM  CBC  WBC 4.0 - 10.5 K/uL 6.0  7.4  6.9   Hemoglobin 12.0 - 15.0 g/dL 86.8  86.3  86.9   Hematocrit 36.0 - 46.0 % 38.3  38.7  38.4   Platelets 150 - 400 K/uL 218  235  239         Latest Ref Rng & Units 08/13/2023   10:42 AM 05/11/2023    8:22 AM 12/24/2022   10:15 AM  CMP  Glucose 70 - 99 mg/dL 898  893  899   BUN 8 - 23 mg/dL 15  16  14    Creatinine 0.44 - 1.00 mg/dL 9.27  9.22  9.26   Sodium 135 - 145 mmol/L 141  142  142   Potassium 3.5 - 5.1 mmol/L 4.1  3.8  3.8   Chloride 98 - 111 mmol/L 102  103  103   CO2 22 - 32 mmol/L 33  32  32   Calcium 8.9 - 10.3 mg/dL 9.5  9.8  89.8   Total Protein 6.5 - 8.1 g/dL 7.6  7.5  7.7   Total Bilirubin 0.0 - 1.2 mg/dL 0.6  0.5  0.5   Alkaline Phos 38 - 126 U/L 60  58  67   AST 15 - 41 U/L 33  28  28   ALT 0 - 44 U/L 27  22  22        RADIOGRAPHIC STUDIES: I have personally reviewed the radiological images as listed and agreed with the findings in the report. No results found.    Orders Placed This Encounter  Procedures   Vitamin B12    Standing Status:   Future    Number of Occurrences:   1    Expected Date:   08/13/2023    Expiration Date:   08/12/2024   Methylmalonic acid, serum    Standing Status:   Future  Number of Occurrences:   1     Expected Date:   08/13/2023    Expiration Date:   08/12/2024   Multiple Myeloma Panel (SPEP&IFE w/QIG)    Standing Status:   Future    Number of Occurrences:   1    Expected Date:   08/13/2023    Expiration Date:   08/12/2024   Kappa/lambda light chains    Standing Status:   Future    Number of Occurrences:   1    Expected Date:   08/13/2023    Expiration Date:   08/12/2024   TSH    Standing Status:   Future    Number of Occurrences:   1    Expected Date:   08/13/2023    Expiration Date:   08/12/2024   T4, free    Standing Status:   Future    Number of Occurrences:   1    Expiration Date:   08/12/2024   All questions were answered. The patient knows to call the clinic with any problems, questions or concerns. No barriers to learning was detected. The total time spent in the appointment was 30 minutes, including review of chart and various tests results, discussions about plan of care and coordination of care plan     Onita Mattock, MD 08/13/2023

## 2023-08-13 NOTE — Assessment & Plan Note (Signed)
 pT1cN0M0, stage IA, er+/pr+/her2-, RS 13 -Diagnosed in 03/2022, s/p lempectomy 04/30/2022 -the Oncotype Dx result was reviewed with her in details. She has low risk based on the recurrence score, which predicts 10 year distant recurrence after 5 years of tamoxifen 4%. There is no benefit for adjuvant chemo --she completed adjuvant radiation in June 2024 -She is on adjuvant exemestane  now, and for 5 to 7 years. It was put on hold in April 2025 due to her neuropathy  -She will continue annual screening mammogram, self exam, and a routine office visit with lab and exam with us .

## 2023-08-14 LAB — KAPPA/LAMBDA LIGHT CHAINS
Kappa free light chain: 29.6 mg/L — ABNORMAL HIGH (ref 3.3–19.4)
Kappa, lambda light chain ratio: 1.8 — ABNORMAL HIGH (ref 0.26–1.65)
Lambda free light chains: 16.4 mg/L (ref 5.7–26.3)

## 2023-08-16 LAB — METHYLMALONIC ACID, SERUM: Methylmalonic Acid, Quantitative: 139 nmol/L (ref 0–378)

## 2023-08-17 LAB — MULTIPLE MYELOMA PANEL, SERUM
Albumin SerPl Elph-Mcnc: 4.3 g/dL (ref 2.9–4.4)
Albumin/Glob SerPl: 1.4 (ref 0.7–1.7)
Alpha 1: 0.2 g/dL (ref 0.0–0.4)
Alpha2 Glob SerPl Elph-Mcnc: 0.6 g/dL (ref 0.4–1.0)
B-Globulin SerPl Elph-Mcnc: 1.1 g/dL (ref 0.7–1.3)
Gamma Glob SerPl Elph-Mcnc: 1.2 g/dL (ref 0.4–1.8)
Globulin, Total: 3.1 g/dL (ref 2.2–3.9)
IgA: 307 mg/dL (ref 64–422)
IgG (Immunoglobin G), Serum: 1369 mg/dL (ref 586–1602)
IgM (Immunoglobulin M), Srm: 78 mg/dL (ref 26–217)
Total Protein ELP: 7.4 g/dL (ref 6.0–8.5)

## 2023-08-18 ENCOUNTER — Telehealth: Payer: Self-pay

## 2023-08-18 NOTE — Telephone Encounter (Signed)
-----   Message from Modena Callander sent at 08/14/2023  7:10 PM EDT ----- Team:  Patient is on schedule with Dr. Margaret as new patient on Sept 12, if he agrees, try to find an earlier spot on him or me.  Thanks.  Yijun ----- Message ----- From: Lanny Callander, MD Sent: 08/13/2023   9:00 PM EDT To: Modena Callander, MD  Erskin Modena,  I referred her in May for peripheral neuropathy, unknown etiology.  She is scheduled to see one of your NP in September, could you do me a favor to see her sooner?  She had a history of breast cancer, no clinical suspicion for recurrence.  B12 level normal.  I though about brain or spine MRI, but if she has severe claustrophobia, may require general anesthesia.  I will let you guys evaluate first.  Thanks  Callander

## 2023-08-18 NOTE — Telephone Encounter (Signed)
 Lvm 1st attempt by hf 08/18/23 at 3:02pm

## 2023-08-18 NOTE — Telephone Encounter (Signed)
 Dr. Onita,  To clarify... did you mean earlier spot with you or doctor penumalli? Thanks,  Production assistant, radio

## 2023-08-26 ENCOUNTER — Ambulatory Visit: Admitting: Neurology

## 2023-08-26 ENCOUNTER — Encounter: Payer: Self-pay | Admitting: Neurology

## 2023-08-26 VITALS — BP 149/80 | HR 75 | Ht 64.0 in | Wt 193.0 lb

## 2023-08-26 DIAGNOSIS — M8580 Other specified disorders of bone density and structure, unspecified site: Secondary | ICD-10-CM | POA: Insufficient documentation

## 2023-08-26 DIAGNOSIS — R202 Paresthesia of skin: Secondary | ICD-10-CM | POA: Insufficient documentation

## 2023-08-26 DIAGNOSIS — Z860100 Personal history of colon polyps, unspecified: Secondary | ICD-10-CM | POA: Insufficient documentation

## 2023-08-26 MED ORDER — ALPRAZOLAM 1 MG PO TABS: ORAL_TABLET | ORAL | 0 refills | Status: AC

## 2023-08-26 NOTE — Progress Notes (Signed)
 Chief Complaint  Patient presents with   RM13/NEUROPATHY    Pt is here Alone. Pt states that she has neuropathy in her feet legs and in her hands. Pt states that she has some tingling in her legs, feet,and her hands. Pt would like to know why she she is having her symptoms.       ASSESSMENT AND PLAN  Kaitlyn Reed is a 73 y.o. female   History of right breast cancer, Worsening paresthesia since March 2025, involving left arm and leg more,  Do have hyperreflexia of left patella on exam,  MRI of the brain with without contrast, cervical spine to rule out right hemisphere pathology and the left cervical radiculopathy  EMG nerve conduction study for possible focal neuropathy  DIAGNOSTIC DATA (LABS, IMAGING, TESTING) - I reviewed patient records, labs, notes, testing and imaging myself where available.   MEDICAL HISTORY:  Kaitlyn Reed is a 73 year old female, seen in request by her oncologist Dr. Lanny, for evaluation of limb paresthesia, her primary care is from [facility] Dr. Cleotilde, [place],    History is obtained from the patient and review of electronic medical records. I personally reviewed pertinent available imaging films in PACS.   PMHx of  HTN Right breast cancer, s/p lobectomy, radiation therapy.  She was diagnosed with right breast cancer following mammogram in March 2024, status postlumpectomy April 2024, adjuvant radiation completed in June 2024, then started adjuvant exemestane  from June 2024 to April 2025  Without clear triggers around March 2025 she noticed numbness starting at her left leg, as if wearing her socks, weird odd sensation, then quickly involving right leg, April 2025, she noticed bilateral arm involvement, left worse than right, at nighttime, she often have to shake her left hand, she has mild gait change, she contributed to her low back and right hip pain  She used to be active, did recover from her radiation treatment in the summer 2024, since  she experienced paresthesia in spring 2025, she felt fatigued, exhausted walking from parking lot to clinic  She also noticed slow worsening urinary urgency,   PHYSICAL EXAM:   Vitals:   08/26/23 1553  BP: (!) 149/80  Pulse: 75  SpO2: 98%  Weight: 193 lb (87.5 kg)  Height: 5' 4 (1.626 m)   Body mass index is 33.13 kg/m.  PHYSICAL EXAMNIATION:  Gen: NAD, conversant, well nourised, well groomed                     Cardiovascular: Regular rate rhythm, no peripheral edema, warm, nontender. Eyes: Conjunctivae clear without exudates or hemorrhage Neck: Supple, no carotid bruits. Pulmonary: Clear to auscultation bilaterally   NEUROLOGICAL EXAM:  MENTAL STATUS: Speech/cognition: Awake, alert, oriented to history taking and casual conversation CRANIAL NERVES: CN II: Visual fields are full to confrontation. Pupils are round equal and briskly reactive to light. CN III, IV, VI: extraocular movement are normal. No ptosis. CN V: Facial sensation is intact to light touch CN VII: Face is symmetric with normal eye closure  CN VIII: Hearing is normal to causal conversation. CN IX, X: Phonation is normal. CN XI: Head turning and shoulder shrug are intact  MOTOR: There is no pronator drift of out-stretched arms. Muscle bulk and tone are normal. Muscle strength is normal.  REFLEXES: Reflexes are 2  and symmetric at the biceps, triceps, asymmetric at knee, R2, L3  and trace at ankles. Plantar responses are flexor.  SENSORY: Mildly decreased vibratory sensation at toes,  COORDINATION: There is no trunk or limb dysmetria noted.  GAIT/STANCE: Push-up, limited by her body habitus, mild lordotic gait  REVIEW OF SYSTEMS:  Full 14 system review of systems performed and notable only for as above All other review of systems were negative.   ALLERGIES: Allergies  Allergen Reactions   Erythromycin Nausea And Vomiting    Upset stomach   No Healthtouch Food Allergies Other (See Comments)     Peanuts-mouth sores/rash   Codeine Nausea And Vomiting    HOME MEDICATIONS: Current Outpatient Medications  Medication Sig Dispense Refill   exemestane (AROMASIN) 25 MG tablet TAKE 1 TABLET (25 MG TOTAL) BY MOUTH DAILY AFTER BREAKFAST. 90 tablet 1   hydrochlorothiazide (HYDRODIURIL) 25 MG tablet Take 25 mg by mouth in the morning.     metoprolol succinate (TOPROL-XL) 25 MG 24 hr tablet Take 25 mg by mouth in the morning.     Multiple Vitamin (MULTIVITAMIN WITH MINERALS) TABS tablet Take 1 tablet by mouth in the morning.     No current facility-administered medications for this visit.    PAST MEDICAL HISTORY: Past Medical History:  Diagnosis Date   Anemia    Hypertension     PAST SURGICAL HISTORY: Past Surgical History:  Procedure Laterality Date   ABDOMINAL HYSTERECTOMY     BREAST BIOPSY Right 03/18/2022   US  RT BREAST BX W LOC DEV 1ST LESION IMG BX SPEC US  GUIDE 03/18/2022 [facility]   BREAST BIOPSY  04/29/2022   MM RT RADIOACTIVE SEED LOC MAMMO GUIDE 04/29/2022 [facility]   BREAST LUMPECTOMY WITH RADIOACTIVE SEED LOCALIZATION Right 04/30/2022   Procedure: RIGHT BREAST LUMPECTOMY WITH RADIOACTIVE SEED LOCALIZATION;  Surgeon: Curvin Deward MOULD, MD;  Location: [facility] OR;  Service: General;  Laterality: Right;   CESAREAN SECTION     CHOLECYSTECTOMY     EYE SURGERY     bilateral cataracts   PARTIAL HYSTERECTOMY      FAMILY HISTORY: Family History  Problem Relation Age of Onset   Multiple myeloma Brother 64   Colon cancer Paternal Grandmother 74 - 78   Lung cancer Paternal Grandfather    Breast cancer Cousin 68 - 20       maternal first cousin   Cancer Cousin        maternal first cousin, unknown type   Melanoma Daughter 76       dx. again at age 66   Cervical cancer Daughter 22    SOCIAL HISTORY: Social History   Socioeconomic History   Marital status: Married    Spouse name: Not on file   Number of children: 3   Years of education: Not on file    Highest education level: Not on file  Occupational History   Not on file  Tobacco Use   Smoking status: Former    Current packs/day: 0.00    Average packs/day: 1 pack/day for 20.0 years (20.0 ttl pk-yrs)    Types: Cigarettes    Start date: 20    Quit date: 85    Years since quitting: 35.6   Smokeless tobacco: Never  Substance and Sexual Activity   Alcohol use: Yes    Comment: 4-6 glasses wine /week   Drug use: Never   Sexual activity: Not on file  Other Topics Concern   Not on file  Social History Narrative   Not on file   Social Drivers of Health   Financial Resource Strain: Not on file  Food Insecurity: No Food Insecurity (05/27/2022)  Hunger Vital Sign    Worried About Running Out of Food in the Last Year: Never true    Ran Out of Food in the Last Year: Never true  Transportation Needs: No Transportation Needs (05/27/2022)   PRAPARE - Administrator, Civil Service (Medical): No    Lack of Transportation (Non-Medical): No  Physical Activity: Not on file  Stress: Not on file  Social Connections: Not on file  Intimate Partner Violence: Not At Risk (05/27/2022)   Humiliation, Afraid, Rape, and Kick questionnaire    Fear of Current or Ex-Partner: No    Emotionally Abused: No    Physically Abused: No    Sexually Abused: No      Modena Callander, M.D. Ph.D.  [facility] [address] [place], [place] [address] Ph: [phone] Fax: [phone]  CC:  Cleotilde Planas, MD [address] [place],  [place] [address]  Cleotilde Planas, MD

## 2023-08-27 ENCOUNTER — Encounter: Payer: Self-pay | Admitting: Hematology

## 2023-09-02 ENCOUNTER — Inpatient Hospital Stay: Attending: Radiation Oncology | Admitting: Hematology

## 2023-09-02 DIAGNOSIS — C50511 Malignant neoplasm of lower-outer quadrant of right female breast: Secondary | ICD-10-CM | POA: Diagnosis not present

## 2023-09-02 DIAGNOSIS — R768 Other specified abnormal immunological findings in serum: Secondary | ICD-10-CM | POA: Diagnosis not present

## 2023-09-02 DIAGNOSIS — Z170 Estrogen receptor positive status [ER+]: Secondary | ICD-10-CM

## 2023-09-02 NOTE — Progress Notes (Signed)
 [facility]   [phone] [phone]   Clinic Follow up Note   Patient Care Team: Cleotilde Planas, MD as PCP - General (Family Medicine) Curvin Deward MOULD, MD as Consulting Physician (General Surgery) Lanny Callander, MD as Consulting Physician (Hematology) Shannon Agent, MD as Consulting Physician (Radiation Oncology) 09/02/2023  I connected with Garen LULLA Qua on 09/02/23 at  3:40 PM EDT by telephone and verified that I am speaking with the correct person using two identifiers.   I discussed the limitations, risks, security and privacy concerns of performing an evaluation and management service by telephone and the availability of in person appointments. I also discussed with the patient that there may be a patient responsible charge related to this service. The patient expressed understanding and agreed to proceed.   Patient's location:  Home  Provider's location:  Office    CHIEF COMPLAINT: f/u lab results    CURRENT THERAPY:  Cancer surveillance, adjuvant antiestrogen therapy on hold for now    Oncology history Malignant neoplasm of lower-outer quadrant of right breast of female, estrogen receptor positive (HCC) pT1cN0M0, stage IA, er+/pr+/her2-, RS 13 -Diagnosed in 03/2022, s/p lempectomy 04/30/2022 -the Oncotype Dx result was reviewed with her in details. She has low risk based on the recurrence score, which predicts 10 year distant recurrence after 5 years of tamoxifen 4%. There is no benefit for adjuvant chemo --she completed adjuvant radiation in June 2024 -She is on adjuvant exemestane  now, and for 5 to 7 years. It was put on hold in April 2025 due to her neuropathy  -She will continue annual screening mammogram, self exam, and a routine office visit with lab and exam with us .   Assessment & Plan Peripheral neuropathy with evaluation for underlying hematologic etiology Peripheral neuropathy with normal B12 and thyroid  function tests. Multiple  myeloma panel negative for M protein, but slightly elevated light chains. Differential diagnosis includes amyloidosis, MGUS, and other blood disorders. No high suspicion for amyloidosis at this time. Awaiting MRI results to further evaluate potential causes. Amyloidosis, multiple myeloma, and MGUS are related but not inheritable, alleviating concerns about familial risk. - Await MRI results before deciding on further biopsy for amyloidosis. - Re-evaluate in three months with repeat light chain level.  Elevated serum free light chains Slightly elevated serum free light chains at 29.6 (normal upper limit 19). Could be nonspecific due to chronic inflammation or infection. Differential includes light chain MGUS and amyloidosis. No immediate concern for multiple myeloma given the level is not significantly elevated. Significantly higher levels would prompt more immediate investigation such as a bone marrow biopsy. - Repeat light chain level in three months.  Plan -lab reviewed  - She has pending brain and cervical spine MRI - Follow-up in 3 months with repeated light chain level 1 week before    SUMMARY OF ONCOLOGIC HISTORY: Oncology History Overview Note   Cancer Staging  Malignant neoplasm of lower-outer quadrant of right breast of female, estrogen receptor positive (HCC) Staging form: Breast, AJCC 8th Edition - Clinical stage from 03/18/2022: Stage IA (cT1b, cN0, cM0, G1, ER+, PR+, HER2-) - Signed by Lanny Callander, MD on 03/26/2022 Stage prefix: Initial diagnosis Histologic grading system: 3 grade system     Malignant neoplasm of lower-outer quadrant of right breast of female, estrogen receptor positive (HCC)  03/18/2022 Cancer Staging   Staging form: Breast, AJCC 8th Edition - Clinical stage from 03/18/2022: Stage IA (cT1b, cN0, cM0, G1, ER+, PR+, HER2-) - Signed by Lanny Callander,  MD on 03/26/2022 Stage prefix: Initial diagnosis Histologic grading system: 3 grade system    Genetic Testing   [facility]  Custom Cancer Panel+RNA was Negative. Report date is 04/01/2022.   The Custom Hereditary Cancers Panel offered by [facility] includes sequencing and/or deletion duplication testing of the following 45 genes: APC, ATM, AXIN2, BAP1, BARD1, BMPR1A, BRCA1, BRCA2, BRIP1, CDH1, CDK4, CDKN2A (p14ARF and p16INK4a only), CHEK2, CTNNA1, EPCAM (Deletion/duplication testing only), FH, GREM1 (promoter region duplication testing only), HOXB13, KIT, MBD4, MEN1, MITF, MLH1, MSH2, MSH3, MSH6, MUTYH, NF1, NHTL1, PALB2, PDGFRA, PMS2, POLD1, POLE, POT1, PTEN, RAD51C, RAD51D, SMAD4, SMARCA4. STK11, TP53, TSC1, TSC2, and VHL.   04/30/2022 Cancer Staging   Staging form: Breast, AJCC 8th Edition - Pathologic stage from 04/30/2022: Stage IA (pT1c, pN0, cM0, G1, ER+, PR+, HER2-, Oncotype DX score: 13) - Signed by Lanny Callander, MD on 07/03/2022 Multigene prognostic tests performed: Oncotype DX Recurrence score range: Greater than or equal to 11 Histologic grading system: 3 grade system Residual tumor (R): R0 - None   04/30/2022 Pathology Results    FINAL MICROSCOPIC DIAGNOSIS:  A. 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  Negative for DCIS     - Closest margin, invasive: Inferior and Anterior, 1.0 mm; Superior, 3.0 mm     - Closet margin, DCIS: Superior and Anterior, 2.0 mm; Inferior, 3.0 mm - Lymphovascular invasion (LVI): Not identified - Calcifications: Present - Prognostic markers:  ER positive (95%), PR positive (95%), Her2 negative (0), Ki-67 (1%) - Other: Small intraductal papilloma with usual ductal hyperplasia (UDH), fibrocystic changes including apocrine metaplasia, usual ductal hyperplasia (UDH) with associated microcalcifications, adenosis and biopsy site changes. - See oncology table and comment  INVASIVE CARCINOMA OF THE BREAST:   Resection  Procedure: Lumpectomy Specimen Laterality: Right Histologic Type: Ductal carcinoma Histologic Grade:      Glandular (Acinar)/Tubular Differentiation: 2      Nuclear Pleomorphism: 2      Mitotic Rate: 1      Overall Grade: 1 Tumor Size: 1.4 cm Ductal Carcinoma In Situ: Present Lymphatic and/or Vascular Invasion: Not identified Treatment Effect in the Breast: No known presurgical therapy Margins: All margins negative for invasive carcinoma      Distance from Closest Margin (mm): 1.0 mm      Specify Closest Margin (required only if <55mm): Inferior and Anterior, 1.0 mm; Superior, 3.0 mm DCIS Margins: Uninvolved by DCIS      Distance from Closest Margin (mm): 2.0 mm      Specify Closest Margin (required only if <47mm): Superior and Anterior, 2.0 mm; Inferior, 3.0 mm Regional Lymph Nodes: Not applicable (no lymph nodes submitted or found)      Distant Metastasis: N/A      Distant Site(s) Involved: N/A Breast Biomarker Testing Performed on Previous Biopsy:      Testing Performed on Case Number: [id number]            Estrogen Receptor: 95%, positive, strong staining intensity            Progesterone Receptor: 95%, positive, strong staining intensity            HER2: Negative (0)            Ki-67: 1% Pathologic Stage Classification (pTNM, AJCC 8th Edition): pT1c, pNx Representative Tumor Block: A2 Comment(s): Immunohistochemical staining for CK5/6 and ER is performed  on blocks A2 and A4.  The staining patterns are consistent with the above diagnosis.  Immunohistochemical staining for CK5/6 and ER is performed on block A8 and the results are noncontributory. (v4.5.0.0)    04/30/2022 Oncotype testing   13/4%, <1% absolute chemo benefit   06/10/2022 - 07/08/2022 Radiation Therapy   Right breast - 40.05 Gy delivered in 15 Fx at 2.67 Gy/Fx Right breast boost - 12 Gy delivered in 6 Fx at 2 Gy/Fx   07/03/2022 -  Anti-estrogen oral therapy   Exemestane  (Aromasin )      Discussed the use of AI scribe software for clinical note transcription with the patient, who gave verbal consent to proceed.  History of Present Illness ORION VANDERVORT is a 73 year old female who presents for a phone visit to review lab results.  She experiences neuropathy, which led to a series of lab tests. Her B12 level is normal at 352, and thyroid  function is normal. The light chain level is slightly elevated at 29.6, above the normal upper limit of 19.  She has a family history of multiple myeloma, with her brother currently undergoing treatment for the condition. He is ten years younger and receives chemotherapy every two weeks following a stem cell transplant last year.     REVIEW OF SYSTEMS:   Constitutional: Denies fevers, chills or abnormal weight loss Eyes: Denies blurriness of vision Ears, nose, mouth, throat, and face: Denies mucositis or sore throat Respiratory: Denies cough, dyspnea or wheezes Cardiovascular: Denies palpitation, chest discomfort or lower extremity swelling Gastrointestinal:  Denies nausea, heartburn or change in bowel habits Skin: Denies abnormal skin rashes Lymphatics: Denies new lymphadenopathy or easy bruising Neurological:Denies numbness, tingling or new weaknesses Behavioral/Psych: Mood is stable, no new changes  All other systems were reviewed with the patient and are negative.  MEDICAL HISTORY:  Past Medical History:  Diagnosis Date   Anemia    Hypertension     SURGICAL HISTORY: Past Surgical History:  Procedure Laterality Date   ABDOMINAL HYSTERECTOMY     BREAST BIOPSY Right 03/18/2022   US  RT BREAST BX W LOC DEV 1ST LESION IMG BX SPEC US  GUIDE 03/18/2022 [facility]   BREAST BIOPSY  04/29/2022   MM RT RADIOACTIVE SEED LOC MAMMO GUIDE 04/29/2022 [facility]   BREAST LUMPECTOMY WITH RADIOACTIVE SEED LOCALIZATION Right 04/30/2022   Procedure: RIGHT BREAST LUMPECTOMY WITH RADIOACTIVE SEED LOCALIZATION;  Surgeon: Curvin Deward MOULD, MD;  Location: [facility] OR;  Service: General;  Laterality: Right;   CESAREAN SECTION     CHOLECYSTECTOMY     EYE SURGERY     bilateral cataracts   PARTIAL HYSTERECTOMY      I have reviewed the social history and family history with the patient and they are unchanged from previous note.  ALLERGIES:  is allergic to erythromycin, no healthtouch food allergies, and codeine.  MEDICATIONS:  Current Outpatient Medications  Medication Sig Dispense Refill   ALPRAZolam  (XANAX ) 1 MG tablet Take 1-2 tablets 30 minutes prior to MRI, may repeat once as needed. Must have driver. 3 tablet 0   exemestane  (AROMASIN ) 25 MG tablet TAKE 1 TABLET (25 MG TOTAL) BY MOUTH DAILY AFTER BREAKFAST. 90 tablet 1   hydrochlorothiazide (HYDRODIURIL) 25 MG tablet Take 25 mg by mouth in the morning.     metoprolol succinate (TOPROL-XL) 25 MG 24 hr tablet Take 25 mg by mouth in the morning.     Multiple Vitamin (MULTIVITAMIN WITH MINERALS) TABS tablet Take 1 tablet by mouth in  the morning.     No current facility-administered medications for this visit.    PHYSICAL EXAMINATION: Not performed   LABORATORY DATA:  I have reviewed the data as listed    Latest Ref Rng & Units 08/13/2023   10:42 AM 05/11/2023    8:22 AM 12/24/2022   10:15 AM  CBC  WBC 4.0 - 10.5 K/uL 6.0  7.4  6.9   Hemoglobin 12.0 - 15.0 g/dL 86.8  86.3  86.9   Hematocrit 36.0 - 46.0 % 38.3  38.7  38.4   Platelets 150 - 400 K/uL 218  235  239         Latest Ref Rng & Units 08/13/2023   10:42 AM 05/11/2023    8:22 AM 12/24/2022   10:15 AM  CMP  Glucose 70 - 99 mg/dL 898  893  899   BUN 8 - 23 mg/dL 15  16  14    Creatinine 0.44 - 1.00 mg/dL 9.27  9.22  9.26   Sodium 135 - 145 mmol/L 141  142  142   Potassium 3.5 - 5.1 mmol/L 4.1  3.8  3.8   Chloride 98 - 111 mmol/L 102  103  103   CO2 22 - 32 mmol/L 33  32  32   Calcium 8.9 - 10.3 mg/dL 9.5  9.8  89.8   Total Protein 6.5 - 8.1 g/dL 7.6  7.5  7.7   Total Bilirubin 0.0 - 1.2 mg/dL 0.6   0.5  0.5   Alkaline Phos 38 - 126 U/L 60  58  67   AST 15 - 41 U/L 33  28  28   ALT 0 - 44 U/L 27  22  22        RADIOGRAPHIC STUDIES: I have personally reviewed the radiological images as listed and agreed with the findings in the report. No results found.     I discussed the assessment and treatment plan with the patient. The patient was provided an opportunity to ask questions and all were answered. The patient agreed with the plan and demonstrated an understanding of the instructions.   The patient was advised to call back or seek an in-person evaluation if the symptoms worsen or if the condition fails to improve as anticipated.  I provided 15 minutes of non face-to-face telephone visit time during this encounter, including review of chart and various tests results, discussions about plan of care and coordination of care plan.    Onita Mattock, MD 09/02/23

## 2023-09-02 NOTE — Assessment & Plan Note (Signed)
 pT1cN0M0, stage IA, er+/pr+/her2-, RS 13 -Diagnosed in 03/2022, s/p lempectomy 04/30/2022 -the Oncotype Dx result was reviewed with her in details. She has low risk based on the recurrence score, which predicts 10 year distant recurrence after 5 years of tamoxifen 4%. There is no benefit for adjuvant chemo --she completed adjuvant radiation in June 2024 -She is on adjuvant exemestane  now, and for 5 to 7 years. It was put on hold in April 2025 due to her neuropathy  -She will continue annual screening mammogram, self exam, and a routine office visit with lab and exam with us .

## 2023-09-04 ENCOUNTER — Telehealth: Payer: Self-pay | Admitting: Neurology

## 2023-09-04 NOTE — Telephone Encounter (Signed)
 Mylene shara: [id number] exp. 09/04/23-11/03/23 sent to [facility] for an open MRI. [phone]

## 2023-09-17 DIAGNOSIS — R202 Paresthesia of skin: Secondary | ICD-10-CM | POA: Diagnosis not present

## 2023-09-17 DIAGNOSIS — M4802 Spinal stenosis, cervical region: Secondary | ICD-10-CM | POA: Diagnosis not present

## 2023-09-25 ENCOUNTER — Ambulatory Visit: Admitting: Diagnostic Neuroimaging

## 2023-09-30 ENCOUNTER — Encounter: Payer: Self-pay | Admitting: Neurology

## 2023-10-01 ENCOUNTER — Telehealth: Payer: Self-pay | Admitting: Neurology

## 2023-10-01 NOTE — Telephone Encounter (Signed)
 Patient returned phone call, transferred to POD2 to speak with [facility]

## 2023-10-01 NOTE — Telephone Encounter (Signed)
 Pt called in and results given. I explained to pt that we need the disc of mri and she voiced understanding

## 2023-10-01 NOTE — Telephone Encounter (Signed)
 Kaitlyn Reed,  I spoke verbally w/Dr. Onita and she would like the actual film or disk. Is there away to obtain that? Thanks,  Production assistant, radio

## 2023-10-01 NOTE — Telephone Encounter (Signed)
 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

## 2023-10-06 NOTE — Telephone Encounter (Signed)
 Disc received. Will place in Dr. Georgianne office

## 2023-11-06 ENCOUNTER — Encounter: Payer: Self-pay | Admitting: Neurology

## 2023-11-09 ENCOUNTER — Telehealth: Admitting: Hematology

## 2023-11-10 ENCOUNTER — Encounter: Payer: Self-pay | Admitting: Neurology

## 2023-11-11 ENCOUNTER — Ambulatory Visit: Admitting: Neurology

## 2023-11-11 ENCOUNTER — Telehealth: Payer: Self-pay | Admitting: Neurology

## 2023-11-11 VITALS — BP 136/74 | HR 62 | Ht 64.5 in

## 2023-11-11 DIAGNOSIS — M4802 Spinal stenosis, cervical region: Secondary | ICD-10-CM | POA: Diagnosis not present

## 2023-11-11 DIAGNOSIS — R202 Paresthesia of skin: Secondary | ICD-10-CM | POA: Diagnosis not present

## 2023-11-11 NOTE — Telephone Encounter (Signed)
Referral for neurosurgery fax to [facility]. Phone: [phone], Fax: [phone]

## 2023-11-11 NOTE — Procedures (Signed)
 Full Name: Kaitlyn Reed Gender: Female MRN #: [id number] Date of Birth: 03-02-1950    Visit Date: 11/11/2023 10:01 Age: 73 Years Examining Physician: Onita Duos Referring Physician: Onita Duos Height: 5 feet 5 inch History: Intermittent neck pain, paresthesia of left arm and leg  Summary of the test: Nerve conduction study: Left sural, superficial peroneal, ulnar sensory responses were normal.  Left median sensory showed mild to moderately prolonged peak latency with mildly decreased snap amplitude.  Left peroneal to EDB, tibial, ulnar motor responses were normal.  Left median motor response showed moderately prolonged distal latency, were otherwise normal.  Electromyography: Selected needle examination of left upper extremity, cervical paraspinal; left lower extremity and lumbar paraspinal muscles were normal.  Conclusion: This is an abnormal study.  There is electrodiagnostic evidence of left distal median neuropathy across the wrist, consistent with moderate left carpal tunnel syndromes.  There was no evidence of left cervical radiculopathy.    ------------------------------- Duos Onita. M.D. Ph.D.  [facility] [address], [address] [place], [place] [address] Tel: [phone] Fax: [phone]  Verbal informed consent was obtained from the patient, patient was informed of potential risk of procedure, including bruising, bleeding, hematoma formation, infection, muscle weakness, muscle pain, numbness, among others.        MNC    Nerve / Sites Muscle Latency Ref. Amplitude Ref. Rel Amp Segments Distance Velocity Ref. Area    ms ms mV mV %  cm m/s m/s mVms  L Median - APB     Wrist APB 5.4 <=4.4 4.1 >=4.0 100 Wrist - APB 7   14.0     Upper arm APB 8.9  3.7  92.1 Upper arm - Wrist 22 64 >=49 13.1  L Ulnar - ADM     Wrist ADM 2.6 <=3.3 11.3 >=6.0 100 Wrist - ADM 7   21.9     B.Elbow ADM 4.7  13.2  117 B.Elbow - Wrist 14 67 >=49 24.5      A.Elbow ADM 7.0  11.7  88.1 A.Elbow - B.Elbow 14 61 >=49 23.6  L Peroneal - EDB     Ankle EDB 5.1 <=6.5 5.5 >=2.0 100 Ankle - EDB 9   17.3     Fib head EDB 10.5  5.0  91.1 Fib head - Ankle 26 48 >=44 16.4     Pop fossa EDB 12.8  5.2  105 Pop fossa - Fib head 12 51 >=44 17.1         Pop fossa - Ankle      L Tibial - AH     Ankle AH 5.7 <=5.8 7.5 >=4.0 100 Ankle - AH 9   17.5     Pop fossa AH 13.9  6.1  80.9 Pop fossa - Ankle 41 50 >=41 16.8             SNC    Nerve / Sites Rec. Site Peak Lat Ref.  Amp Ref. Segments Distance    ms ms V V  cm  L Sural - Ankle (Calf)     Calf Ankle 3.8 <=4.4 7 >=6 Calf - Ankle 14  L Superficial peroneal - Ankle     Lat leg Ankle 3.8 <=4.4 7 >=6 Lat leg - Ankle 14  L Median - Orthodromic (Dig II, Mid palm)     Dig II Wrist 4.3 <=3.4 6 >=10 Dig II - Wrist 13  L Ulnar - Orthodromic, (Dig V, Mid palm)  Dig V Wrist 2.9 <=3.1 13 >=5 Dig V - Wrist 38             F  Wave    Nerve F Lat Ref.   ms ms  L Ulnar - ADM 27.2 <=32.0  L Tibial - AH 53.9 <=56.0         EMG Summary Table    Spontaneous MUAP Recruitment  Muscle IA Fib PSW Fasc Other Amp Dur. Poly Pattern  L. Tibialis posterior Normal None None None _______ Normal Normal Normal Normal  L. Tibialis anterior Normal None None None _______ Normal Normal Normal Normal  L. Peroneus longus Normal None None None _______ Normal Normal Normal Normal  L. Gastrocnemius (Medial head) Normal None None None _______ Normal Normal Normal Normal  L. Vastus lateralis Normal None None None _______ Normal Normal Normal Normal  L. Lumbar paraspinals (low) Normal None None None _______ Normal Normal Normal Normal  L. Lumbar paraspinals (mid) Normal None None None _______ Normal Normal Normal Normal  L. First dorsal interosseous Normal None None None _______ Normal Normal Normal Normal  L. Extensor digitorum communis Normal None None None _______ Normal Normal Normal Normal  L. Biceps brachii Normal None None None  _______ Normal Normal Normal Normal  L. Triceps brachii Normal None None None _______ Normal Normal Normal Normal  L. Deltoid Normal None None None _______ Normal Normal Normal Normal  L. Cervical paraspinals Normal None None None _______ Normal Normal Normal Normal

## 2023-11-11 NOTE — Progress Notes (Addendum)
 Chief Complaint  Patient presents with   ncs/emg     Rm4, alone, Pt is well and ready for ncs/emg      ASSESSMENT AND PLAN  Kaitlyn Reed is a 73 y.o. 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   MEDICAL HISTORY:  Kaitlyn Reed is a 73 year old female, seen in request by her oncologist Dr. Lanny, for evaluation of limb paresthesia, her primary care is from [facility] Dr. Cleotilde, [place],    History is obtained from the patient and review of electronic medical records. I personally reviewed pertinent available imaging films in PACS.   PMHx of  HTN Right breast cancer, s/p lobectomy, radiation therapy.  She was diagnosed with right breast cancer following mammogram in March 2024, status postlumpectomy April 2024, adjuvant radiation completed in June 2024, then started adjuvant exemestane  from June 2024 to April 2025  Without clear triggers around March 2025 she noticed numbness starting at her left leg, as if wearing her socks, weird odd sensation, then quickly involving right leg, April 2025,  she noticed bilateral arm involvement, left worse than right, at nighttime, she often have to shake her left hand, she has mild gait change, she contributed to her low back and right hip pain  She used to be active, did recover from her radiation treatment in the summer 2024, since she experienced paresthesia in spring 2025, she felt fatigued, exhausted walking from parking lot to clinic  She also noticed slow worsening urinary urgency,  UPDATE Nov 11 2023; Continue complaints of intermittent left arm numbness, denied persistent sensorimotor deficit, chronic neck pain, cracking sound turning her neck,  Had MRI of cervical spine [facility] September 4,multilevel degenerative changes most noticeable C6-7, 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  PHYSICAL EXAMNIATION:  Gen: NAD, conversant, well nourised, well groomed                     Cardiovascular: Regular rate rhythm, no peripheral edema, warm, nontender. Eyes: Conjunctivae clear without exudates or hemorrhage Neck: Supple, no carotid bruits. Pulmonary: Clear to auscultation bilaterally   NEUROLOGICAL EXAM:  MENTAL STATUS: Speech/cognition: Awake, alert, oriented to history taking and casual conversation CRANIAL NERVES:  CN II: Visual fields are full to confrontation. Pupils are round equal and briskly reactive to light. CN III, IV, VI: extraocular movement are normal. No ptosis. CN V: Facial sensation is intact  to light touch CN VII: Face is symmetric with normal eye closure  CN VIII: Hearing is normal to causal conversation. CN IX, X: Phonation is normal. CN XI: Head turning and shoulder shrug are intact  MOTOR: There is no pronator drift of out-stretched arms. Muscle bulk and tone are normal. Muscle strength is normal.  REFLEXES: Reflexes are 2  and symmetric at the biceps, triceps, asymmetric at knee, R2, L3  and trace at ankles. Plantar responses are flexor.  SENSORY: Mildly decreased vibratory sensation at toes,  COORDINATION: There is no trunk or limb dysmetria noted.  GAIT/STANCE: Push-up,cautious  REVIEW OF SYSTEMS:  Full 14 system review of systems performed and notable only for as above All other review of systems were negative.   ALLERGIES: Allergies  Allergen Reactions   Erythromycin Nausea And Vomiting    Upset stomach   No [employer] Food Allergies Other (See Comments)    Peanuts-mouth sores/rash   Codeine Nausea And Vomiting    HOME MEDICATIONS: Current Outpatient Medications  Medication Sig Dispense Refill   ALPRAZolam  (XANAX ) 1 MG tablet Take 1-2 tablets 30 minutes prior to MRI, may repeat once as needed. Must have driver. 3 tablet 0   hydrochlorothiazide (HYDRODIURIL) 25 MG tablet Take 25 mg by mouth in the morning.     metoprolol succinate (TOPROL-XL) 25 MG 24 hr tablet Take 25 mg by mouth in the morning.     Multiple Vitamin (MULTIVITAMIN WITH MINERALS) TABS tablet Take 1 tablet by mouth in the morning.     No current facility-administered medications for this visit.    PAST MEDICAL HISTORY: Past Medical History:  Diagnosis Date   Anemia    Hypertension     PAST SURGICAL HISTORY: Past Surgical History:  Procedure Laterality Date   ABDOMINAL HYSTERECTOMY     BREAST BIOPSY Right 03/18/2022   US  RT BREAST BX W LOC DEV 1ST LESION IMG BX SPEC US  GUIDE 03/18/2022 [facility]   BREAST BIOPSY  04/29/2022   MM RT RADIOACTIVE SEED LOC MAMMO GUIDE  04/29/2022 [facility]   BREAST LUMPECTOMY WITH RADIOACTIVE SEED LOCALIZATION Right 04/30/2022   Procedure: RIGHT BREAST LUMPECTOMY WITH RADIOACTIVE SEED LOCALIZATION;  Surgeon: Curvin Deward MOULD, MD;  Location: [facility] OR;  Service: General;  Laterality: Right;   CESAREAN SECTION     CHOLECYSTECTOMY     EYE SURGERY     bilateral cataracts   PARTIAL HYSTERECTOMY      FAMILY HISTORY: Family History  Problem Relation Age of Onset   Multiple myeloma Brother 56   Colon cancer Paternal Grandmother 78 - 78   Lung cancer Paternal Grandfather    Breast cancer Cousin 48 - 32       maternal first cousin   Cancer Cousin        maternal first cousin, unknown type   Melanoma Daughter 10       dx. again at age 50   Cervical cancer Daughter 65    SOCIAL HISTORY: Social History   Socioeconomic History   Marital status: Married    Spouse name: Not on file   Number of children: 3   Years of education: Not on file   Highest education level: Not on file  Occupational History   Not on file  Tobacco Use   Smoking  status: Former    Current packs/day: 0.00    Average packs/day: 1 pack/day for 20.0 years (20.0 ttl pk-yrs)    Types: Cigarettes    Start date: 22    Quit date: 37    Years since quitting: 35.8   Smokeless tobacco: Never  Substance and Sexual Activity   Alcohol use: Yes    Comment: 4-6 glasses wine /week   Drug use: Never   Sexual activity: Not on file  Other Topics Concern   Not on file  Social History Narrative   Not on file   Social Drivers of Health   Financial Resource Strain: Not on file  Food Insecurity: No Food Insecurity (05/27/2022)   Hunger Vital Sign    Worried About Running Out of Food in the Last Year: Never true    Ran Out of Food in the Last Year: Never true  Transportation Needs: No Transportation Needs (05/27/2022)   PRAPARE - Administrator, Civil Service (Medical): No    Lack of Transportation (Non-Medical): No  Physical Activity: Not  on file  Stress: Not on file  Social Connections: Not on file  Intimate Partner Violence: Not At Risk (05/27/2022)   Humiliation, Afraid, Rape, and Kick questionnaire    Fear of Current or Ex-Partner: No    Emotionally Abused: No    Physically Abused: No    Sexually Abused: No      Modena Callander, M.D. Ph.D.  [facility] [address], [address] [place], [place] [address] Ph: [phone] Fax: [phone]  CC:  Cleotilde Planas, MD [address] [place],  [place] [address]  Cleotilde Planas, MD

## 2023-12-02 DIAGNOSIS — D1801 Hemangioma of skin and subcutaneous tissue: Secondary | ICD-10-CM | POA: Diagnosis not present

## 2023-12-02 DIAGNOSIS — D2372 Other benign neoplasm of skin of left lower limb, including hip: Secondary | ICD-10-CM | POA: Diagnosis not present

## 2023-12-02 DIAGNOSIS — D3612 Benign neoplasm of peripheral nerves and autonomic nervous system, upper limb, including shoulder: Secondary | ICD-10-CM | POA: Diagnosis not present

## 2023-12-02 DIAGNOSIS — L570 Actinic keratosis: Secondary | ICD-10-CM | POA: Diagnosis not present

## 2023-12-02 DIAGNOSIS — L812 Freckles: Secondary | ICD-10-CM | POA: Diagnosis not present

## 2023-12-02 DIAGNOSIS — L821 Other seborrheic keratosis: Secondary | ICD-10-CM | POA: Diagnosis not present

## 2023-12-02 DIAGNOSIS — D2371 Other benign neoplasm of skin of right lower limb, including hip: Secondary | ICD-10-CM | POA: Diagnosis not present

## 2023-12-02 DIAGNOSIS — D225 Melanocytic nevi of trunk: Secondary | ICD-10-CM | POA: Diagnosis not present

## 2023-12-18 ENCOUNTER — Encounter: Payer: Self-pay | Admitting: Hematology

## 2023-12-21 ENCOUNTER — Other Ambulatory Visit: Payer: Self-pay

## 2023-12-25 ENCOUNTER — Other Ambulatory Visit: Payer: Self-pay

## 2023-12-25 DIAGNOSIS — C50511 Malignant neoplasm of lower-outer quadrant of right female breast: Secondary | ICD-10-CM

## 2023-12-25 NOTE — Progress Notes (Signed)
 As per Dr. Lanny, order placed in portal for [facility] Successfully. Kit taken to lab to be drawn 12/15.

## 2023-12-28 ENCOUNTER — Inpatient Hospital Stay: Attending: Hematology

## 2023-12-28 DIAGNOSIS — G629 Polyneuropathy, unspecified: Secondary | ICD-10-CM | POA: Insufficient documentation

## 2023-12-28 DIAGNOSIS — Z1721 Progesterone receptor positive status: Secondary | ICD-10-CM | POA: Insufficient documentation

## 2023-12-28 DIAGNOSIS — R269 Unspecified abnormalities of gait and mobility: Secondary | ICD-10-CM | POA: Insufficient documentation

## 2023-12-28 DIAGNOSIS — R5383 Other fatigue: Secondary | ICD-10-CM | POA: Insufficient documentation

## 2023-12-28 DIAGNOSIS — Z79811 Long term (current) use of aromatase inhibitors: Secondary | ICD-10-CM | POA: Diagnosis not present

## 2023-12-28 DIAGNOSIS — I10 Essential (primary) hypertension: Secondary | ICD-10-CM | POA: Diagnosis not present

## 2023-12-28 DIAGNOSIS — Z1732 Human epidermal growth factor receptor 2 negative status: Secondary | ICD-10-CM | POA: Diagnosis not present

## 2023-12-28 DIAGNOSIS — Z170 Estrogen receptor positive status [ER+]: Secondary | ICD-10-CM | POA: Diagnosis not present

## 2023-12-28 DIAGNOSIS — Z923 Personal history of irradiation: Secondary | ICD-10-CM | POA: Insufficient documentation

## 2023-12-28 DIAGNOSIS — R0600 Dyspnea, unspecified: Secondary | ICD-10-CM | POA: Diagnosis not present

## 2023-12-28 DIAGNOSIS — R7689 Other specified abnormal immunological findings in serum: Secondary | ICD-10-CM

## 2023-12-28 DIAGNOSIS — D472 Monoclonal gammopathy: Secondary | ICD-10-CM | POA: Diagnosis not present

## 2023-12-28 DIAGNOSIS — C50511 Malignant neoplasm of lower-outer quadrant of right female breast: Secondary | ICD-10-CM | POA: Diagnosis present

## 2023-12-28 LAB — CMP (CANCER CENTER ONLY)
ALT: 27 U/L (ref 0–44)
AST: 39 U/L (ref 15–41)
Albumin: 4.4 g/dL (ref 3.5–5.0)
Alkaline Phosphatase: 72 U/L (ref 38–126)
Anion gap: 11 (ref 5–15)
BUN: 16 mg/dL (ref 8–23)
CO2: 29 mmol/L (ref 22–32)
Calcium: 9.9 mg/dL (ref 8.9–10.3)
Chloride: 103 mmol/L (ref 98–111)
Creatinine: 0.86 mg/dL (ref 0.44–1.00)
GFR, Estimated: >60 mL/min (ref >60)
Glucose, Bld: 109 mg/dL — ABNORMAL HIGH (ref 70–99)
Potassium: 3.4 mmol/L — ABNORMAL LOW (ref 3.5–5.1)
Sodium: 142 mmol/L (ref 135–145)
Total Bilirubin: 0.4 mg/dL (ref 0.0–1.2)
Total Protein: 7.7 g/dL (ref 6.5–8.1)

## 2023-12-28 LAB — CBC WITH DIFFERENTIAL (CANCER CENTER ONLY)
Abs Immature Granulocytes: 0.02 K/uL (ref 0.00–0.07)
Basophils Absolute: 0.1 K/uL (ref 0.0–0.1)
Basophils Relative: 1 %
Eosinophils Absolute: 0.3 K/uL (ref 0.0–0.5)
Eosinophils Relative: 3 %
HCT: 37.1 % (ref 36.0–46.0)
Hemoglobin: 12.8 g/dL (ref 12.0–15.0)
Immature Granulocytes: 0 %
Lymphocytes Relative: 28 %
Lymphs Abs: 2.4 K/uL (ref 0.7–4.0)
MCH: 30 pg (ref 26.0–34.0)
MCHC: 34.5 g/dL (ref 30.0–36.0)
MCV: 86.9 fL (ref 80.0–100.0)
Monocytes Absolute: 0.5 K/uL (ref 0.1–1.0)
Monocytes Relative: 6 %
Neutro Abs: 5.2 K/uL (ref 1.7–7.7)
Neutrophils Relative %: 62 %
Platelet Count: 233 K/uL (ref 150–400)
RBC: 4.27 MIL/uL (ref 3.87–5.11)
RDW: 13.1 % (ref 11.5–15.5)
WBC Count: 8.5 K/uL (ref 4.0–10.5)
nRBC: 0 % (ref 0.0–0.2)

## 2023-12-29 LAB — KAPPA/LAMBDA LIGHT CHAINS
Kappa free light chain: 28.8 mg/L — ABNORMAL HIGH (ref 3.3–19.4)
Kappa, lambda light chain ratio: 1.97 — ABNORMAL HIGH (ref 0.26–1.65)
Lambda free light chains: 14.6 mg/L (ref 5.7–26.3)

## 2024-01-05 ENCOUNTER — Other Ambulatory Visit: Payer: Self-pay | Admitting: Hematology

## 2024-01-05 ENCOUNTER — Inpatient Hospital Stay: Admitting: Hematology

## 2024-01-05 VITALS — BP 134/66 | HR 74 | Temp 97.3°F | Resp 17 | Wt 194.0 lb

## 2024-01-05 DIAGNOSIS — C50511 Malignant neoplasm of lower-outer quadrant of right female breast: Secondary | ICD-10-CM | POA: Diagnosis not present

## 2024-01-05 DIAGNOSIS — R0600 Dyspnea, unspecified: Secondary | ICD-10-CM

## 2024-01-05 DIAGNOSIS — Z1321 Encounter for screening for nutritional disorder: Secondary | ICD-10-CM | POA: Diagnosis not present

## 2024-01-05 DIAGNOSIS — Z170 Estrogen receptor positive status [ER+]: Secondary | ICD-10-CM

## 2024-01-05 DIAGNOSIS — Z13220 Encounter for screening for lipoid disorders: Secondary | ICD-10-CM | POA: Diagnosis not present

## 2024-01-05 MED ORDER — EXEMESTANE 25 MG PO TABS: 12.5000 mg | ORAL_TABLET | Freq: Every day | ORAL | 1 refills | Status: AC

## 2024-01-05 NOTE — Progress Notes (Signed)
 " [facility]   [phone] Fax:([phone]   Clinic Follow up Note   Patient Care Team: Cleotilde Planas, MD as PCP - General (Family Medicine) Curvin Deward MOULD, MD as Consulting Physician (General Surgery) Lanny Callander, MD as Consulting Physician (Hematology) Shannon Agent, MD as Consulting Physician (Radiation Oncology)  Date of Service:  01/05/2024  CHIEF COMPLAINT: fatigue and dyspnea on exertion   CURRENT THERAPY:  Observation   Oncology History   Malignant neoplasm of lower-outer quadrant of right breast of female, estrogen receptor positive (HCC) pT1cN0M0, stage IA, er+/pr+/her2-, RS 13 -Diagnosed in 03/2022, s/p lempectomy 04/30/2022 -the Oncotype Dx result was reviewed with her in details. She has low risk based on the recurrence score, which predicts 10 year distant recurrence after 5 years of tamoxifen 4%. There is no benefit for adjuvant chemo --she completed adjuvant radiation in June 2024 -She is on adjuvant exemestane  now, and for 5 to 7 years. It was put on hold in April 2025 due to her neuropathy  -She will continue annual screening mammogram, self exam, and a routine office visit with lab and exam with us .  Assessment & Plan Estrogen receptor positive right breast cancer She is post-treatment for stage I, estrogen receptor positive right breast cancer with a low recurrence score. She discontinued anastrozole over six months ago due to hypertension and bone pain, with subsequent improvement in blood pressure. She reports intermittent neuropathic pain in the right breast, attributed to prior surgery. No palpable masses on self-exam; last mammogram was normal. She is due for her next mammogram in February. Recurrence risk remains low but not zero. She expressed concern regarding endocrine therapy side effects and was reluctant to restart anastrozole at full dose. Tamoxifen was discussed but avoided due to increased stroke risk. She elected to restart  anastrozole at a reduced dose to mitigate side effects, understanding this may still reduce recurrence risk. - Ordered diagnostic mammogram for February. - Restarted anastrozole at half dose to reduce side effects. - Discussed tamoxifen as alternative but avoided due to increased stroke risk. - Plan for Signatera testing at next visit.  Left-sided neuropathy and gait disturbance secondary to cervical spinal stenosis She has chronic left-sided neuropathy and gait disturbance, with intermittent nocturnal numbness and tingling, and unsteady gait. MRI demonstrates moderate to severe cervical spinal stenosis at C6-7, likely etiology. No significant weakness, but reports occasional dropping of objects and instability while ambulating. Neurosurgical evaluation has not yet been scheduled despite referral. - Recommended neurosurgery consultation for evaluation and management of cervical spinal stenosis. - Encouraged regular exercise and physical activity to improve core strength and mobility. - Advised fall precautions due to gait instability.  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 Low suspicion for multiple myeloma or amyloidosis given unilateral neuropathy and absence of systemic symptoms.  - Ordered 24-hour urine collection for protein and light chain quantification. - Plan to monitor serum light chain levels. - If significant light chain detected in urine, will consider tissue biopsy (bone or fat) to evaluate for amyloidosis or multiple myeloma.  Dyspnea and fatigue, under evaluation She reports increased fatigue and exertional dyspnea, with reduced exercise tolerance and frequent need for rest during ambulation. Family history of cardiac disease. Thyroid  and B12 levels are normal. No acute findings on recent imaging. - Ordered echocardiogram to evaluate cardiac function. - Deferred CT  scan; will order if symptoms worsen or if she desires  further evaluation. - Encouraged regular exercise and physical activity.  Screening for vitamin deficiency She takes a multivitamin but is uncertain about vitamin D intake. B12 and thyroid  levels are normal. Vitamin D status remains to be evaluated. - Ordered vitamin D level.  Screening for lipid disorders She has hypercholesterolemia without recent lipid testing. She is reluctant to initiate statin therapy due to concern for side effects and is amenable to dietary and lifestyle modifications. - Plan to check cholesterol at next visit. - Encouraged dietary modification, exercise, and weight loss as initial management.  Plan -I reviewed her MRI from 09/2023 --order echo for her work up of dyspnea  -will order vitD and lipid panel per pt's requst -24h urine for UPEP/IFE and light chain level  -order MM in 02/2024 -OV in 3-4 months with lab 1-2 weeks prior  -she will restart exemestane  in half dose    SUMMARY OF ONCOLOGIC HISTORY: Oncology History Overview Note   Cancer Staging  Malignant neoplasm of lower-outer quadrant of right breast of female, estrogen receptor positive (HCC) Staging form: Breast, AJCC 8th Edition - Clinical stage from 03/18/2022: Stage IA (cT1b, cN0, cM0, G1, ER+, PR+, HER2-) - Signed by Lanny Callander, MD on 03/26/2022 Stage prefix: Initial diagnosis Histologic grading system: 3 grade system     Malignant neoplasm of lower-outer quadrant of right breast of female, estrogen receptor positive (HCC)  03/18/2022 Cancer Staging   Staging form: Breast, AJCC 8th Edition - Clinical stage from 03/18/2022: Stage IA (cT1b, cN0, cM0, G1, ER+, PR+, HER2-) - Signed by Lanny Callander, MD on 03/26/2022 Stage prefix: Initial diagnosis Histologic grading system: 3 grade system    Genetic Testing   [facility] Custom Cancer Panel+RNA was Negative. Report date is 04/01/2022.   The Custom Hereditary Cancers Panel offered by [facility] includes  sequencing and/or deletion duplication testing of the following 45 genes: APC, ATM, AXIN2, BAP1, BARD1, BMPR1A, BRCA1, BRCA2, BRIP1, CDH1, CDK4, CDKN2A (p14ARF and p16INK4a only), CHEK2, CTNNA1, EPCAM (Deletion/duplication testing only), FH, GREM1 (promoter region duplication testing only), HOXB13, KIT, MBD4, MEN1, MITF, MLH1, MSH2, MSH3, MSH6, MUTYH, NF1, NHTL1, PALB2, PDGFRA, PMS2, POLD1, POLE, POT1, PTEN, RAD51C, RAD51D, SMAD4, SMARCA4. STK11, TP53, TSC1, TSC2, and VHL.   04/30/2022 Cancer Staging   Staging form: Breast, AJCC 8th Edition - Pathologic stage from 04/30/2022: Stage IA (pT1c, pN0, cM0, G1, ER+, PR+, HER2-, Oncotype DX score: 13) - Signed by Lanny Callander, MD on 07/03/2022 Multigene prognostic tests performed: Oncotype DX Recurrence score range: Greater than or equal to 11 Histologic grading system: 3 grade system Residual tumor (R): R0 - None   04/30/2022 Pathology Results    FINAL MICROSCOPIC DIAGNOSIS:  A. 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  Negative for DCIS     - Closest margin, invasive: Inferior and Anterior, 1.0 mm; Superior, 3.0 mm     - Closet margin, DCIS: Superior and Anterior, 2.0 mm; Inferior, 3.0 mm - Lymphovascular invasion (LVI): Not identified - Calcifications: Present - Prognostic markers:  ER positive (95%), PR positive (95%), Her2 negative (0), Ki-67 (1%) - Other: Small intraductal papilloma with usual ductal hyperplasia (UDH), fibrocystic changes including apocrine metaplasia, usual ductal hyperplasia (UDH) with associated microcalcifications, adenosis and biopsy site changes. - See oncology table and comment  INVASIVE CARCINOMA OF THE BREAST:  Resection  Procedure: Lumpectomy Specimen Laterality: Right Histologic Type: Ductal  carcinoma Histologic Grade:      Glandular  (Acinar)/Tubular Differentiation: 2      Nuclear Pleomorphism: 2      Mitotic Rate: 1      Overall Grade: 1 Tumor Size: 1.4 cm Ductal Carcinoma In Situ: Present Lymphatic and/or Vascular Invasion: Not identified Treatment Effect in the Breast: No known presurgical therapy Margins: All margins negative for invasive carcinoma      Distance from Closest Margin (mm): 1.0 mm      Specify Closest Margin (required only if <46mm): Inferior and Anterior, 1.0 mm; Superior, 3.0 mm DCIS Margins: Uninvolved by DCIS      Distance from Closest Margin (mm): 2.0 mm      Specify Closest Margin (required only if <84mm): Superior and Anterior, 2.0 mm; Inferior, 3.0 mm Regional Lymph Nodes: Not applicable (no lymph nodes submitted or found)      Distant Metastasis: N/A      Distant Site(s) Involved: N/A Breast Biomarker Testing Performed on Previous Biopsy:      Testing Performed on Case Number: [id number]            Estrogen Receptor: 95%, positive, strong staining intensity            Progesterone Receptor: 95%, positive, strong staining intensity            HER2: Negative (0)            Ki-67: 1% Pathologic Stage Classification (pTNM, AJCC 8th Edition): pT1c, pNx Representative Tumor Block: A2 Comment(s): Immunohistochemical staining for CK5/6 and ER is performed on blocks A2 and A4.  The staining patterns are consistent with the above diagnosis.  Immunohistochemical staining for CK5/6 and ER is performed on block A8 and the results are noncontributory. (v4.5.0.0)    04/30/2022 Oncotype testing   13/4%, <1% absolute chemo benefit   06/10/2022 - 07/08/2022 Radiation Therapy   Right breast - 40.05 Gy delivered in 15 Fx at 2.67 Gy/Fx Right breast boost - 12 Gy delivered in 6 Fx at 2 Gy/Fx   07/03/2022 -  Anti-estrogen oral therapy   Exemestane  (Aromasin )      Discussed the use of AI scribe software for clinical note transcription with the patient, who gave verbal consent to proceed.  History  of Present Illness Kaitlyn Reed is a 73 year old female with stage I, estrogen receptor positive right breast cancer (status post surgery and radiation, completed >6 months of anastrozole, now off therapy) who presents for follow-up of left-sided neuropathy, ongoing cancer surveillance, and evaluation of fatigue and monoclonal gammopathy.  She completed surgery and radiation for stage I ER+ right breast cancer and stopped anastrozole over six months ago due to elevated blood pressure and bone pain, with subsequent improvement in blood pressure. She is worried about recurrence and is unsure about restarting endocrine therapy because of prior side effects. For several weeks she has had intermittent shooting pain in the right breast, similar to pre-diagnosis, but notes no masses on self-exam. Her last mammogram was normal and a repeat is due in February. She denies new palpable abnormalities, erythema, or swelling.  She has ongoing left-sided neuropathy with intermittent nocturnal numbness and paresthesia, mainly in the left arm and occasionally in the left leg and both feet, sometimes disrupting sleep. She denies persistent weakness but has occasionally dropped objects and feels her gait is unsteady, with family and her phone noting increased fall risk. She has known degenerative disc disease and was told her cervical spine MRI showed C6-7 abnormalities. She  has not yet scheduled a neurosurgical evaluation despite referral.  For the past year she has had persistent fatigue and reduced exercise tolerance, needing frequent rest when walking or shopping and during recent travel, despite using support. She is frustrated by decreased stamina and is concerned about possible cardiac disease given exertional dyspnea and a family history of heart disease. She has not had a recent echocardiogram or lipid panel. She takes a multivitamin that may include vitamin D but uses no additional supplementation. Prior  thyroid  and B12 levels have been normal.  She has a mild, stable elevation in serum light chains (28.8, upper limit 19.4) unchanged over four months. She has not completed a 24-hour urine protein collection. She denies bilateral progressive neuropathy and constitutional symptoms.     All other systems were reviewed with the patient and are negative.  MEDICAL HISTORY:  Past Medical History:  Diagnosis Date   Anemia    Hypertension     SURGICAL HISTORY: Past Surgical History:  Procedure Laterality Date   ABDOMINAL HYSTERECTOMY     BREAST BIOPSY Right 03/18/2022   US  RT BREAST BX W LOC DEV 1ST LESION IMG BX SPEC US  GUIDE 03/18/2022 [facility]   BREAST BIOPSY  04/29/2022   MM RT RADIOACTIVE SEED LOC MAMMO GUIDE 04/29/2022 [facility]   BREAST LUMPECTOMY WITH RADIOACTIVE SEED LOCALIZATION Right 04/30/2022   Procedure: RIGHT BREAST LUMPECTOMY WITH RADIOACTIVE SEED LOCALIZATION;  Surgeon: Curvin Deward MOULD, MD;  Location: [facility] OR;  Service: General;  Laterality: Right;   CESAREAN SECTION     CHOLECYSTECTOMY     EYE SURGERY     bilateral cataracts   PARTIAL HYSTERECTOMY      I have reviewed the social history and family history with the patient and they are unchanged from previous note.  ALLERGIES:  is allergic to erythromycin, no healthtouch food allergies, and codeine.  MEDICATIONS:  Current Outpatient Medications  Medication Sig Dispense Refill   ALPRAZolam  (XANAX ) 1 MG tablet Take 1-2 tablets 30 minutes prior to MRI, may repeat once as needed. Must have driver. 3 tablet 0   hydrochlorothiazide (HYDRODIURIL) 25 MG tablet Take 25 mg by mouth in the morning.     metoprolol succinate (TOPROL-XL) 25 MG 24 hr tablet Take 25 mg by mouth in the morning.     Multiple Vitamin (MULTIVITAMIN WITH MINERALS) TABS tablet Take 1 tablet by mouth in the morning.     No current facility-administered medications for this visit.    PHYSICAL EXAMINATION: ECOG PERFORMANCE STATUS: 1 -  Symptomatic but completely ambulatory  Vitals:   01/05/24 0942 01/05/24 0943  BP: (!) 140/67 134/66  Pulse: 74   Resp: 17   Temp: (!) 97.3 F (36.3 C)   SpO2: 99%    Wt Readings from Last 3 Encounters:  01/05/24 194 lb (88 kg)  08/26/23 193 lb (87.5 kg)  08/13/23 194 lb 6.4 oz (88.2 kg)     GENERAL:alert, no distress and comfortable SKIN: skin color, texture, turgor are normal, no rashes or significant lesions EYES: normal, Conjunctiva are pink and non-injected, sclera clear NECK: supple, thyroid  normal size, non-tender, without nodularity LYMPH:  no palpable lymphadenopathy in the cervical, axillary  LUNGS: clear to auscultation and percussion with normal breathing effort HEART: regular rate & rhythm and no murmurs and no lower extremity edema ABDOMEN:abdomen soft, non-tender and normal bowel sounds Musculoskeletal:no cyanosis of digits and no clubbing  NEURO: alert & oriented x 3 with fluent speech, no focal motor/sensory deficits  Physical  Exam BREAST: Breasts normal, no abnormalities.  LABORATORY DATA:  I have reviewed the data as listed    Latest Ref Rng & Units 12/28/2023    2:19 PM 08/13/2023   10:42 AM 05/11/2023    8:22 AM  CBC  WBC 4.0 - 10.5 K/uL 8.5  6.0  7.4   Hemoglobin 12.0 - 15.0 g/dL 87.1  86.8  86.3   Hematocrit 36.0 - 46.0 % 37.1  38.3  38.7   Platelets 150 - 400 K/uL 233  218  235         Latest Ref Rng & Units 12/28/2023    2:19 PM 08/13/2023   10:42 AM 05/11/2023    8:22 AM  CMP  Glucose 70 - 99 mg/dL 890  898  893   BUN 8 - 23 mg/dL 16  15  16    Creatinine 0.44 - 1.00 mg/dL 9.13  9.27  9.22   Sodium 135 - 145 mmol/L 142  141  142   Potassium 3.5 - 5.1 mmol/L 3.4  4.1  3.8   Chloride 98 - 111 mmol/L 103  102  103   CO2 22 - 32 mmol/L 29  33  32   Calcium 8.9 - 10.3 mg/dL 9.9  9.5  9.8   Total Protein 6.5 - 8.1 g/dL 7.7  7.6  7.5   Total Bilirubin 0.0 - 1.2 mg/dL 0.4  0.6  0.5   Alkaline Phos 38 - 126 U/L 72  60  58   AST 15 - 41 U/L 39   33  28   ALT 0 - 44 U/L 27  27  22        RADIOGRAPHIC STUDIES: I have personally reviewed the radiological images as listed and agreed with the findings in the report. No results found.    Orders Placed This Encounter  Procedures   MM 3D DIAGNOSTIC MAMMOGRAM BILATERAL BREAST    Standing Status:   Future    Expected Date:   03/09/2024    Expiration Date:   01/04/2025    Reason for Exam (SYMPTOM  OR DIAGNOSIS REQUIRED):   BILATERAL diagnostic mammogram in 12 months to continue with post lumpectomy surveillance protocol    Preferred imaging location?:   [facility]   24 Hr Ur Kappa/Lambda/Light Chains, Free w Ratio    Standing Status:   Future    Expected Date:   01/19/2024    Expiration Date:   01/04/2025   Lipid panel    Standing Status:   Future    Expected Date:   04/04/2024    Expiration Date:   01/04/2025   Vitamin D 25 hydroxy    Standing Status:   Future    Expected Date:   04/04/2024    Expiration Date:   01/04/2025   ECHOCARDIOGRAM COMPLETE    Standing Status:   Future    Expected Date:   02/05/2024    Expiration Date:   01/04/2025    Where should this test be performed:   [place]    Perflutren DEFINITY (image enhancing agent) should be administered unless hypersensitivity or allergy exist:   Administer Perflutren    Reason for exam-Echo:   Dyspnea  R06.00   All questions were answered. The patient knows to call the clinic with any problems, questions or concerns. No barriers to learning was detected. The total time spent in the appointment was 40 minutes, including review of chart and various tests results, discussions about plan of care and  coordination of care plan     Onita Mattock, MD 01/05/2024     "

## 2024-01-05 NOTE — Assessment & Plan Note (Signed)
 pT1cN0M0, stage IA, er+/pr+/her2-, RS 13 -Diagnosed in 03/2022, s/p lempectomy 04/30/2022 -the Oncotype Dx result was reviewed with her in details. She has low risk based on the recurrence score, which predicts 10 year distant recurrence after 5 years of tamoxifen 4%. There is no benefit for adjuvant chemo --she completed adjuvant radiation in June 2024 -She is on adjuvant exemestane  now, and for 5 to 7 years. It was put on hold in April 2025 due to her neuropathy  -She will continue annual screening mammogram, self exam, and a routine office visit with lab and exam with us .

## 2024-01-12 ENCOUNTER — Encounter: Payer: Self-pay | Admitting: Hematology

## 2024-01-15 ENCOUNTER — Encounter: Payer: Self-pay | Admitting: Hematology

## 2024-02-04 ENCOUNTER — Ambulatory Visit (HOSPITAL_COMMUNITY)
Admission: RE | Admit: 2024-02-04 | Discharge: 2024-02-04 | Disposition: A | Discharge status: Home / Self Care | Source: Ambulatory Visit | Attending: Hematology | Admitting: Hematology

## 2024-02-04 DIAGNOSIS — R0600 Dyspnea, unspecified: Secondary | ICD-10-CM | POA: Diagnosis present

## 2024-02-04 DIAGNOSIS — I10 Essential (primary) hypertension: Secondary | ICD-10-CM | POA: Insufficient documentation

## 2024-02-04 LAB — ECHOCARDIOGRAM COMPLETE
Area-P 1/2: 3.08 cm2
Calc EF: 66.3 %
S' Lateral: 2.7 cm
Single Plane A2C EF: 65.6 %
Single Plane A4C EF: 67.6 %

## 2024-02-15 ENCOUNTER — Encounter: Payer: Self-pay | Admitting: Hematology

## 2024-03-10 ENCOUNTER — Encounter

## 2024-04-06 ENCOUNTER — Inpatient Hospital Stay

## 2024-04-11 ENCOUNTER — Inpatient Hospital Stay: Admitting: Hematology
